# Patient Record
Sex: Male | Born: 2000 | Race: White | Hispanic: No | Marital: Single | Smoking: Never smoker
Health system: Southern US, Community
[De-identification: ages and names within clinical notes are randomized; demographics above are authoritative.]

## PROBLEM LIST (undated history)

## (undated) DIAGNOSIS — F819 Developmental disorder of scholastic skills, unspecified: Secondary | ICD-10-CM

## (undated) DIAGNOSIS — F909 Attention-deficit hyperactivity disorder, unspecified type: Secondary | ICD-10-CM

## (undated) DIAGNOSIS — R251 Tremor, unspecified: Secondary | ICD-10-CM

## (undated) DIAGNOSIS — D229 Melanocytic nevi, unspecified: Secondary | ICD-10-CM

## (undated) DIAGNOSIS — J45909 Unspecified asthma, uncomplicated: Secondary | ICD-10-CM

## (undated) HISTORY — DX: Developmental disorder of scholastic skills, unspecified: F81.9

## (undated) HISTORY — DX: Tremor, unspecified: R25.1

## (undated) HISTORY — DX: Melanocytic nevi, unspecified: D22.9

## (undated) HISTORY — DX: Unspecified asthma, uncomplicated: J45.909

## (undated) HISTORY — PX: OTHER SURGICAL HISTORY: SHX169

## (undated) HISTORY — PX: IRRIGATION AND DEBRIDEMENT SEBACEOUS CYST: SHX5255

## (undated) HISTORY — PX: CIRCUMCISION: SUR203

---
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
## 2019-02-23 ENCOUNTER — Encounter: Payer: BLUE CROSS/BLUE SHIELD | Admitting: Family Medicine

## 2019-04-01 ENCOUNTER — Other Ambulatory Visit: Payer: Self-pay

## 2019-04-01 ENCOUNTER — Ambulatory Visit (INDEPENDENT_AMBULATORY_CARE_PROVIDER_SITE_OTHER): Payer: BLUE CROSS/BLUE SHIELD | Admitting: Family Medicine

## 2019-04-01 DIAGNOSIS — F909 Attention-deficit hyperactivity disorder, unspecified type: Secondary | ICD-10-CM | POA: Diagnosis not present

## 2019-04-01 MED ORDER — AMPHETAMINE-DEXTROAMPHET ER 20 MG PO CP24: 20.0000 mg | ORAL_CAPSULE | ORAL | 0 refills | Status: DC

## 2019-04-01 NOTE — Progress Notes (Signed)
   Subjective:    Patient ID: James Carson, male    DOB: [birth year], 18 y.o.   MRN: [id number]  HPI  Patient calls for an ADHD check up. Patient just restarted medication 3 days ago and is having no problems. This patient is taking 2 online classes plans on graduating toward the end of May although the graduation may be delayed until they can have gatherings He is doing well with his classes English and history and studying those online he feels he is adjusting well He denies any setbacks Denies any other particular health issues or problems  Virtual Visit via Video Note  I connected with James Carson on 04/01/19 at  1:10 PM EDT by a video enabled telemedicine application and verified that I am speaking with the correct person using two identifiers.  Location: Patient: home Provider: office   I discussed the limitations of evaluation and management by telemedicine and the availability of in person appointments. The patient expressed understanding and agreed to proceed.  History of Present Illness:    Observations/Objective:   Assessment and Plan:   Follow Up Instructions:    I discussed the assessment and treatment plan with the patient. The patient was provided an opportunity to ask questions and all were answered. The patient agreed with the plan and demonstrated an understanding of the instructions.   The patient was advised to call back or seek an in-person evaluation if the symptoms worsen or if the condition fails to improve as anticipated.  I provided 15 minutes of non-face-to-face time during this encounter.      Review of Systems     Objective:   Physical Exam        Assessment & Plan:  ADD Overall doing well on medication Patient will watch closely if doing well otherwise we will follow-up in 3 months otherwise will call us if any problems Young man will graduate he is not sure about college later this year no other particular health  problems follow-up in 3 months

## 2019-07-22 ENCOUNTER — Emergency Department
Admission: EM | Admit: 2019-07-22 | Discharge: 2019-07-22 | Disposition: A | Discharge status: Home / Self Care | Payer: No Typology Code available for payment source | Attending: Emergency Medicine | Admitting: Emergency Medicine

## 2019-07-22 ENCOUNTER — Emergency Department: Payer: No Typology Code available for payment source

## 2019-07-22 DIAGNOSIS — Z79899 Other long term (current) drug therapy: Secondary | ICD-10-CM | POA: Insufficient documentation

## 2019-07-22 DIAGNOSIS — S6991XA Unspecified injury of right wrist, hand and finger(s), initial encounter: Secondary | ICD-10-CM | POA: Diagnosis present

## 2019-07-22 DIAGNOSIS — S60450A Superficial foreign body of right index finger, initial encounter: Secondary | ICD-10-CM | POA: Diagnosis not present

## 2019-07-22 DIAGNOSIS — Y9389 Activity, other specified: Secondary | ICD-10-CM | POA: Insufficient documentation

## 2019-07-22 DIAGNOSIS — J45909 Unspecified asthma, uncomplicated: Secondary | ICD-10-CM | POA: Insufficient documentation

## 2019-07-22 DIAGNOSIS — W458XXA Other foreign body or object entering through skin, initial encounter: Secondary | ICD-10-CM | POA: Insufficient documentation

## 2019-07-22 DIAGNOSIS — Z1810 Retained metal fragments, unspecified: Secondary | ICD-10-CM | POA: Diagnosis not present

## 2019-07-22 DIAGNOSIS — S60459A Superficial foreign body of unspecified finger, initial encounter: Secondary | ICD-10-CM

## 2019-07-22 DIAGNOSIS — Y990 Civilian activity done for income or pay: Secondary | ICD-10-CM | POA: Diagnosis not present

## 2019-07-22 DIAGNOSIS — Y9289 Other specified places as the place of occurrence of the external cause: Secondary | ICD-10-CM | POA: Insufficient documentation

## 2019-07-22 NOTE — ED Notes (Signed)
Per workmans comp profile, UDS and breath analysys is required and  Blood ETOH is acceptable if BAt machine is not available.  Was unable to get machine to transfer over to printer.  Performed blood ETOH level as per profile states

## 2019-07-22 NOTE — ED Triage Notes (Signed)
See paper chart for downtime documentation 

## 2019-07-22 NOTE — ED Provider Notes (Signed)
[facility] Emergency Department Provider Note   First MD Initiated Contact with Patient 07/22/19 [phone]     (approximate)  I have reviewed the triage vital signs and the nursing notes.   HISTORY  Chief Complaint Foreign Body   HPI James Carson is a 18 y.o. male with below list of previous medical conditions presents to the emergency department secondary to weaving needle stuck in his right index finger which occurred while at work tonight.        Past Medical History:  Diagnosis Date  . ADHD (attention deficit hyperactivity disorder)   . Asthma   . Atypical nevi    right shoulder  . Learning disability    dyslexia  . Tremors of nervous system    benign tremors-Dr. Gaynell Face    Patient Active Problem List   Diagnosis Date Noted  . Learning disability 07/21/2014  . ADD (attention deficit disorder) 12/15/2013    Past Surgical History:  Procedure Laterality Date  . CIRCUMCISION    . IRRIGATION AND DEBRIDEMENT SEBACEOUS CYST    . ritlumph    . tubes in the ears      Prior to Admission medications   Medication Sig Start Date End Date Taking? Authorizing Provider  amphetamine-dextroamphetamine (ADDERALL XR) 20 MG 24 hr capsule Take 1 capsule (20 mg total) by mouth every morning. 04/01/19   Kathyrn Drown, MD  amphetamine-dextroamphetamine (ADDERALL XR) 20 MG 24 hr capsule Take 1 capsule (20 mg total) by mouth every morning. 04/01/19   Kathyrn Drown, MD  amphetamine-dextroamphetamine (ADDERALL XR) 20 MG 24 hr capsule Take 1 capsule (20 mg total) by mouth every morning. 04/01/19   Kathyrn Drown, MD  ondansetron (ZOFRAN) 8 MG tablet Take 1 tablet (8 mg total) by mouth every 8 (eight) hours as needed for nausea. Patient not taking: Reported on 11/17/2018 05/16/18   Kathyrn Drown, MD    Allergies Patient has no known allergies.  No family history on file.  Social History Social History   Tobacco Use  . Smoking status: Never Smoker   . Smokeless tobacco: Never Used  Substance Use Topics  . Alcohol use: No    Alcohol/week: 0.0 standard drinks  . Drug use: No    Review of Systems Constitutional: No fever/chills Eyes: No visual changes. ENT: No sore throat. Cardiovascular: Denies chest pain. Respiratory: Denies shortness of breath. Gastrointestinal: No abdominal pain.  No nausea, no vomiting.  No diarrhea.  No constipation. Genitourinary: Negative for dysuria. Musculoskeletal: Negative for neck pain.  Negative for back pain.  Needle stuck in the right index finger Integumentary: Negative for rash. Neurological: Negative for headaches, focal weakness or numbness.  ____________________________________________   PHYSICAL EXAM:  VITAL SIGNS: ED Triage Vitals  Enc Vitals Group     BP 07/22/19 0631 128/62     Pulse Rate 07/22/19 0632 72     Resp --      Temp --      Temp src --      SpO2 07/22/19 0632 98 %     Weight --      Height --      Head Circumference --      Peak Flow --      Pain Score 07/22/19 0633 0     Pain Loc --      Pain Edu? --      Excl. in [place]? --     Constitutional: Alert and oriented.  Eyes: Conjunctivae  are normal.  Mouth/Throat: Mucous membranes are moist. Neck: No stridor.  No meningeal signs.   Cardiovascular: Normal rate, regular rhythm. Good peripheral circulation. Grossly normal heart sounds. Respiratory: Normal respiratory effort.  No retractions. Gastrointestinal: Soft and nontender. No distention.  Musculoskeletal: No lower extremity tenderness nor edema. No gross deformities of extremities. Neurologic:  Normal speech and language. No gross focal neurologic deficits are appreciated.  Skin:  Skin is warm, dry and intact. Psychiatric: Mood and affect are normal. Speech and behavior are normal.   ____________________________________________  RADIOLOGY I, Lawtey N Carrieanne Kleen, personally viewed and evaluated these images (plain radiographs) as part of my medical decision  making, as well as reviewing the written report by the radiologist.  ED MD interpretation: Metallic foreign body in the distal soft tissue of the base of the right index finger.  Official radiology report(s): Dg Finger Index Right  Result Date: 07/22/2019 CLINICAL DATA:  Needle stuck at distal RIGHT index finger, injured at work EXAM: RIGHT INDEX FINGER 2+V needle stuck at distal RIGHT index finger, injured at work COMPARISON:  None FINDINGS: Osseous mineralization normal. Joint spaces preserved. No fracture, dislocation, or bone destruction. Lung linear metallic foreign body consistent with a needle is identified at the soft tissues at the radial aspect of the base of the distal phalanx. The wire/needle has a hook at the tip. IMPRESSION: Metallic foreign body at radial soft tissues at base of distal phalanx RIGHT index finger, containing a hook at the tip. No acute osseous abnormalities. Electronically Signed   By: Lavonia Dana M.D.   On: 07/22/2019 11:47     Procedures   ____________________________________________   INITIAL IMPRESSION / MDM / [place] / ED COURSE  As part of my medical decision making, I reviewed the following data within the electronic MEDICAL RECORD NUMBER   18 year old male with right index finger foreign body.  Finger was cleaned with Betadine 3 mL's of lidocaine 1% without epinephrine was introduced subcutaneously.  Gentle traction was applied after a 2 mm incision was made and the needle was removed without difficulty.  Patient tolerated procedure well.       ____________________________________________  FINAL CLINICAL IMPRESSION(S) / ED DIAGNOSES  Final diagnoses:  Foreign body of right index finger     MEDICATIONS GIVEN DURING THIS VISIT:  Medications - No data to display   ED Discharge Orders    None      *Please note:  James Carson was evaluated in Emergency Department on 07/22/2019 for the symptoms described in the history of  present illness. He was evaluated in the context of the global COVID-19 pandemic, which necessitated consideration that the patient might be at risk for infection with the SARS-CoV-2 virus that causes COVID-19. Institutional protocols and algorithms that pertain to the evaluation of patients at risk for COVID-19 are in a state of rapid change based on information released by regulatory bodies including the CDC and federal and state organizations. These policies and algorithms were followed during the patient's care in the ED.  Some ED evaluations and interventions may be delayed as a result of limited staffing during the pandemic.*  Note:  This document was prepared using Dragon voice recognition software and may include unintentional dictation errors.   Gregor Hams, MD 07/22/19 [phone]

## 2019-08-16 ENCOUNTER — Other Ambulatory Visit: Payer: Self-pay | Admitting: Family Medicine

## 2019-08-16 ENCOUNTER — Telehealth: Payer: Self-pay | Admitting: Family Medicine

## 2019-08-16 MED ORDER — AMPHETAMINE-DEXTROAMPHET ER 20 MG PO CP24: 20.0000 mg | ORAL_CAPSULE | ORAL | 0 refills | Status: DC

## 2019-08-16 NOTE — Telephone Encounter (Signed)
Patient has an appt on 08-30-19 but is out of amphetamine-dextroamphetamine (ADDERALL XR) 20 MG 24 hr capsule  [facility] [address]

## 2019-08-16 NOTE — Telephone Encounter (Signed)
[facility]-[address] follow-up appointment

## 2019-08-27 ENCOUNTER — Ambulatory Visit: Payer: Self-pay | Admitting: Family Medicine

## 2019-08-30 ENCOUNTER — Other Ambulatory Visit: Payer: Self-pay

## 2019-08-30 ENCOUNTER — Ambulatory Visit (INDEPENDENT_AMBULATORY_CARE_PROVIDER_SITE_OTHER): Payer: BC Managed Care – PPO | Admitting: Family Medicine

## 2019-08-30 DIAGNOSIS — F909 Attention-deficit hyperactivity disorder, unspecified type: Secondary | ICD-10-CM | POA: Diagnosis not present

## 2019-08-30 MED ORDER — AMPHETAMINE-DEXTROAMPHET ER 20 MG PO CP24: 20.0000 mg | ORAL_CAPSULE | ORAL | 0 refills | Status: DC

## 2019-08-30 NOTE — Progress Notes (Signed)
   Subjective:    Patient ID: James Carson, male    DOB: 13-Nov-2001, 18 y.o.   MRN: [id number]  HPI Patient was seen today for ADD checkup.  This patient does have ADD.  Patient takes medications for this.  If this does help control overall symptoms.  Please see below. -weight, vital signs reviewed.  The following items were covered. -Compliance with medication : Adderall 20 mg each morning  -Problems with completing homework, paying attention/taking good notes in school: none; pt graduated in the spring  -grades: n/a  - Eating patterns : mom states that med changes pt appetite  -sleeping: ok  -Additional issues or questions: none  Virtual Visit via Video Note  I connected with James Carson on 08/30/19 at  8:30 AM EDT by a video enabled telemedicine application and verified that I am speaking with the correct person using two identifiers.  Location: Patient: Home Provider: Office   I discussed the limitations of evaluation and management by telemedicine and the availability of in person appointments. The patient expressed understanding and agreed to proceed.  History of Present Illness:    Observations/Objective:   Assessment and Plan:   Follow Up Instructions:    I discussed the assessment and treatment plan with the patient. The patient was provided an opportunity to ask questions and all were answered. The patient agreed with the plan and demonstrated an understanding of the instructions.   The patient was advised to call back or seek an in-person evaluation if the symptoms worsen or if the condition fails to improve as anticipated.  I provided 15 minutes of non-face-to-face time during this encounter.   Vicente Males, LPN    Review of Systems  Constitutional: Negative for activity change, appetite change and fatigue.  HENT: Negative for congestion and rhinorrhea.   Respiratory: Negative for cough and shortness of breath.   Cardiovascular:  Negative for chest pain and leg swelling.  Gastrointestinal: Negative for abdominal pain, nausea and vomiting.  Neurological: Negative for dizziness and headaches.  Psychiatric/Behavioral: Negative for agitation and behavioral problems.       Objective:   Physical Exam  Patient had virtual visit Appears to be in no distress Atraumatic Neuro able to relate and oriented No apparent resp distress Color normal       Assessment & Plan:  The patient was seen today as part of the visit regarding ADD. Medications were reviewed with the patient as well as compliance. Side effects were checked for. Discussion regarding effectiveness was held. Prescriptions were written. Patient reminded to follow-up in approximately 3 months. Behavioral and study issues were addressed.  Plans to [facility] law with drug registry was checked and verified while present with the patient. He is now working at [employer] during the day Work is going well for him initially he was on the third shift job and that was not a good fit so he is switched over to [employer] and working there  He states the medicine is doing well for him we will send in 3 additional scripts and we will encourage patient to do a follow-up in early January and a wellness check in the spring  Also patient encouraged to do flu shot

## 2020-01-19 ENCOUNTER — Other Ambulatory Visit: Payer: Self-pay | Admitting: Family Medicine

## 2020-01-19 MED ORDER — AMPHETAMINE-DEXTROAMPHET ER 20 MG PO CP24: 20.0000 mg | ORAL_CAPSULE | ORAL | 0 refills | Status: DC

## 2020-01-27 ENCOUNTER — Other Ambulatory Visit: Payer: Self-pay

## 2020-01-27 ENCOUNTER — Ambulatory Visit: Payer: BC Managed Care – PPO

## 2020-01-27 DIAGNOSIS — Z20822 Contact with and (suspected) exposure to covid-19: Secondary | ICD-10-CM

## 2020-01-27 LAB — POC COVID19 BINAXNOW: SARS Coronavirus 2 Ag: NEGATIVE

## 2020-01-27 NOTE — Progress Notes (Signed)
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

## 2020-02-09 ENCOUNTER — Ambulatory Visit
Admission: EM | Admit: 2020-02-09 | Discharge: 2020-02-09 | Disposition: A | Discharge status: Home / Self Care | Payer: BC Managed Care – PPO

## 2020-02-09 ENCOUNTER — Other Ambulatory Visit: Payer: Self-pay

## 2020-02-09 DIAGNOSIS — S6991XA Unspecified injury of right wrist, hand and finger(s), initial encounter: Secondary | ICD-10-CM

## 2020-02-09 NOTE — ED Triage Notes (Signed)
Pt presents to UC w/ c/o fish hook stuck in right thumb which occurred a few minutes ago. Pt denies bleeding.

## 2020-02-09 NOTE — ED Provider Notes (Addendum)
[facility]   [id number] 02/09/20 Arrival Time: 1914   [id number] hook foreign body  SUBJECTIVE:  James Carson is a 19 y.o. male who presents with a fish hook to RT thumb that occurred a few hours ago.  Denies bleeding.  Tried to remove at home without relief.  Reports previous symptoms in the past and had to go to the ED to have it removed.  Denies fever, chills, nausea, vomiting.    ROS: As per HPI.  All other pertinent ROS negative.     Past Medical History:  Diagnosis Date  . ADHD (attention deficit hyperactivity disorder)   . Asthma   . Atypical nevi    right shoulder  . Learning disability    dyslexia  . Tremors of nervous system    benign tremors-Dr. Gaynell Face   Past Surgical History:  Procedure Laterality Date  . CIRCUMCISION    . IRRIGATION AND DEBRIDEMENT SEBACEOUS CYST    . ritlumph    . tubes in the ears     No Known Allergies No current facility-administered medications on file prior to encounter.   Current Outpatient Medications on File Prior to Encounter  Medication Sig Dispense Refill  . amphetamine-dextroamphetamine (ADDERALL XR) 20 MG 24 hr capsule Take 1 capsule (20 mg total) by mouth every morning. 30 capsule 0  . amphetamine-dextroamphetamine (ADDERALL XR) 20 MG 24 hr capsule Take 1 capsule (20 mg total) by mouth every morning. 30 capsule 0  . amphetamine-dextroamphetamine (ADDERALL XR) 20 MG 24 hr capsule Take 1 capsule (20 mg total) by mouth every morning. 30 capsule 0  . ondansetron (ZOFRAN) 8 MG tablet Take 1 tablet (8 mg total) by mouth every 8 (eight) hours as needed for nausea. (Patient not taking: Reported on 11/17/2018) 14 tablet 2   Social History   Socioeconomic History  . Marital status: Single    Spouse name: Not on file  . Number of children: Not on file  . Years of education: Not on file  . Highest education level: Not on file  Occupational History  . Not on file  Tobacco Use  . Smoking status: Never Smoker  .  Smokeless tobacco: Never Used  Substance and Sexual Activity  . Alcohol use: No    Alcohol/week: 0.0 standard drinks  . Drug use: No  . Sexual activity: Not on file  Other Topics Concern  . Not on file  Social History Narrative  . Not on file   Social Determinants of Health   Financial Resource Strain:   . Difficulty of Paying Living Expenses: Not on file  Food Insecurity:   . Worried About Charity fundraiser in the Last Year: Not on file  . Ran Out of Food in the Last Year: Not on file  Transportation Needs:   . Lack of Transportation (Medical): Not on file  . Lack of Transportation (Non-Medical): Not on file  Physical Activity:   . Days of Exercise per Week: Not on file  . Minutes of Exercise per Session: Not on file  Stress:   . Feeling of Stress : Not on file  Social Connections:   . Frequency of Communication with Friends and Family: Not on file  . Frequency of Social Gatherings with Friends and Family: Not on file  . Attends Religious Services: Not on file  . Active Member of Clubs or Organizations: Not on file  . Attends Archivist Meetings: Not on file  . Marital Status: Not on file  Intimate Partner Violence:   . Fear of Current or Ex-Partner: Not on file  . Emotionally Abused: Not on file  . Physically Abused: Not on file  . Sexually Abused: Not on file   Family History  Problem Relation Age of Onset  . Healthy Mother   . Healthy Father     OBJECTIVE:  Vitals:   02/09/20 1920  BP: 122/72  Pulse: 79  Resp: 17  Temp: 98.8 F (37.1 C)  TempSrc: Tympanic  SpO2: 98%     General appearance: alert; no distress Skin: Small fish hook with barb present to ventral aspect of distal RT thumb Psychological: alert and cooperative; normal mood and affect  Procedure: Verbal consent obtained. Puncture wound with FB cleaned with betadine. Lidocaine 2% without epinephrine used to obtain local anesthesia. Fish hook removed using kelly hemostat.   Tolerated procedure well.  No complications  ASSESSMENT & PLAN:  1. Fish hook injury of right thumb, initial encounter    Fish hook removed Dressing applied Keep covered to avoid friction Return or go to the ED if you have any new or worsening symptoms such as increased redness, swelling, pain, nausea, vomiting, fever, chills, etc...   Reviewed expectations re: course of current medical issues. Questions answered. Outlined signs and symptoms indicating need for more acute intervention. Patient verbalized understanding. After Visit Summary given.          Lestine Box, PA-C 02/09/20 2007    Stacey Drain Mason, [place] 02/10/20 [phone]

## 2020-02-09 NOTE — Discharge Instructions (Addendum)
Fish hook removed Dressing applied Keep covered to avoid friction Return or go to the ED if you have any new or worsening symptoms such as increased redness, swelling, pain, nausea, vomiting, fever, chills, etc..Marland Kitchen

## 2020-02-15 ENCOUNTER — Other Ambulatory Visit: Payer: Self-pay | Admitting: Family Medicine

## 2020-02-15 MED ORDER — AMPHETAMINE-DEXTROAMPHET ER 20 MG PO CP24: 20.0000 mg | ORAL_CAPSULE | ORAL | 0 refills | Status: DC

## 2020-02-17 ENCOUNTER — Ambulatory Visit (INDEPENDENT_AMBULATORY_CARE_PROVIDER_SITE_OTHER): Payer: BC Managed Care – PPO | Admitting: Family Medicine

## 2020-02-17 DIAGNOSIS — F909 Attention-deficit hyperactivity disorder, unspecified type: Secondary | ICD-10-CM

## 2020-02-17 NOTE — Progress Notes (Signed)
   Subjective:    Patient ID: James Carson, male    DOB: 2001/09/04, 19 y.o.   MRN: [id number]  HPI Patient calls today for refill on his ADHD medications. Patient states everything seems to be going well and does not have any other concerns. Patient working at [employer] his medication on a regular basis States overall things are going well for him Recently he lost his grandfather and is been difficult for him to work through that he feels he is working through his emotions.  I asked him if he is feeling stressed or anxious for having difficulty sleeping and he states he is not having any trouble. Patient does golfing and fishing for hobbies Patient denies any setbacks or problems His ADD medicine does allow him to help cope with function Virtual Visit via Video Note  I connected with James Carson on 02/17/20 at  3:30 PM EDT by a video enabled telemedicine application and verified that I am speaking with the correct person using two identifiers.  Location: Patient: home Provider: office   I discussed the limitations of evaluation and management by telemedicine and the availability of in person appointments. The patient expressed understanding and agreed to proceed.  History of Present Illness:    Observations/Objective:   Assessment and Plan:   Follow Up Instructions:    I discussed the assessment and treatment plan with the patient. The patient was provided an opportunity to ask questions and all were answered. The patient agreed with the plan and demonstrated an understanding of the instructions.   The patient was advised to call back or seek an in-person evaluation if the symptoms worsen or if the condition fails to improve as anticipated.  I provided 16 minutes of non-face-to-face time during this encounter.      Review of Systems  Constitutional: Negative for activity change, appetite change and fatigue.  HENT: Negative for congestion and  rhinorrhea.   Respiratory: Negative for cough and shortness of breath.   Cardiovascular: Negative for chest pain and leg swelling.  Gastrointestinal: Negative for abdominal pain, nausea and vomiting.  Neurological: Negative for dizziness and headaches.  Psychiatric/Behavioral: Negative for agitation and behavioral problems.       Objective:   Physical Exam   Patient had virtual visit Appears to be in no distress Atraumatic Neuro able to relate and oriented No apparent resp distress Color normal      Assessment & Plan:  Adult ADD Does well with medicine 3 scripts will be sent in Drug registry was checked Follow-up if progressive troubles or if worse Patient did have a loss of her grandfather but he states he is working through his motions and does not feel he needs any type of additional care for this Denies sleeping issues or stress issues I did indicate to the patient should he start feeling like he is feeling down sad or depressed to follow-up or call us we will be happy to help

## 2020-02-18 MED ORDER — AMPHETAMINE-DEXTROAMPHET ER 20 MG PO CP24: 20.0000 mg | ORAL_CAPSULE | ORAL | 0 refills | Status: DC

## 2020-03-29 ENCOUNTER — Emergency Department (HOSPITAL_COMMUNITY): Payer: BC Managed Care – PPO

## 2020-03-29 ENCOUNTER — Encounter: Payer: Self-pay | Admitting: Family Medicine

## 2020-03-29 ENCOUNTER — Encounter (HOSPITAL_COMMUNITY): Payer: Self-pay | Admitting: Emergency Medicine

## 2020-03-29 ENCOUNTER — Emergency Department (HOSPITAL_COMMUNITY)
Admission: EM | Admit: 2020-03-29 | Discharge: 2020-03-29 | Disposition: A | Discharge status: Home / Self Care | Payer: BC Managed Care – PPO | Attending: Emergency Medicine | Admitting: Emergency Medicine

## 2020-03-29 ENCOUNTER — Other Ambulatory Visit: Payer: Self-pay

## 2020-03-29 DIAGNOSIS — F909 Attention-deficit hyperactivity disorder, unspecified type: Secondary | ICD-10-CM | POA: Diagnosis not present

## 2020-03-29 DIAGNOSIS — W010XXA Fall on same level from slipping, tripping and stumbling without subsequent striking against object, initial encounter: Secondary | ICD-10-CM | POA: Insufficient documentation

## 2020-03-29 DIAGNOSIS — Z79899 Other long term (current) drug therapy: Secondary | ICD-10-CM | POA: Insufficient documentation

## 2020-03-29 DIAGNOSIS — M545 Low back pain: Secondary | ICD-10-CM | POA: Diagnosis not present

## 2020-03-29 DIAGNOSIS — M791 Myalgia, unspecified site: Secondary | ICD-10-CM | POA: Diagnosis not present

## 2020-03-29 DIAGNOSIS — Y999 Unspecified external cause status: Secondary | ICD-10-CM | POA: Diagnosis not present

## 2020-03-29 DIAGNOSIS — Y93I9 Activity, other involving external motion: Secondary | ICD-10-CM | POA: Diagnosis not present

## 2020-03-29 DIAGNOSIS — M7918 Myalgia, other site: Secondary | ICD-10-CM

## 2020-03-29 DIAGNOSIS — Y92410 Unspecified street and highway as the place of occurrence of the external cause: Secondary | ICD-10-CM | POA: Insufficient documentation

## 2020-03-29 DIAGNOSIS — J45909 Unspecified asthma, uncomplicated: Secondary | ICD-10-CM | POA: Insufficient documentation

## 2020-03-29 DIAGNOSIS — M542 Cervicalgia: Secondary | ICD-10-CM | POA: Diagnosis not present

## 2020-03-29 DIAGNOSIS — S59912A Unspecified injury of left forearm, initial encounter: Secondary | ICD-10-CM | POA: Diagnosis not present

## 2020-03-29 DIAGNOSIS — S4992XA Unspecified injury of left shoulder and upper arm, initial encounter: Secondary | ICD-10-CM | POA: Diagnosis not present

## 2020-03-29 DIAGNOSIS — M25512 Pain in left shoulder: Secondary | ICD-10-CM | POA: Diagnosis not present

## 2020-03-29 MED ORDER — ACETAMINOPHEN 500 MG PO TABS
1000.0000 mg | ORAL_TABLET | Freq: Once | ORAL | Status: DC
  Filled 2020-03-29: qty 2

## 2020-03-29 NOTE — Discharge Instructions (Signed)
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
## 2020-03-30 ENCOUNTER — Encounter: Payer: Self-pay | Admitting: Family Medicine

## 2020-03-30 ENCOUNTER — Ambulatory Visit (INDEPENDENT_AMBULATORY_CARE_PROVIDER_SITE_OTHER): Payer: BC Managed Care – PPO | Admitting: Family Medicine

## 2020-03-30 VITALS — BP 126/72 | Temp 97.9°F | Wt 154.6 lb

## 2020-03-30 DIAGNOSIS — F909 Attention-deficit hyperactivity disorder, unspecified type: Secondary | ICD-10-CM | POA: Diagnosis not present

## 2020-03-30 DIAGNOSIS — M25512 Pain in left shoulder: Secondary | ICD-10-CM | POA: Diagnosis not present

## 2020-03-30 MED ORDER — AMPHETAMINE-DEXTROAMPHET ER 30 MG PO CP24: 30.0000 mg | ORAL_CAPSULE | ORAL | 0 refills | Status: DC

## 2020-03-30 NOTE — Patient Instructions (Signed)
Give [place] update in 30 days how new dose is doing
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
## 2020-04-24 ENCOUNTER — Encounter: Payer: Self-pay | Admitting: Family Medicine

## 2020-04-24 ENCOUNTER — Other Ambulatory Visit: Payer: Self-pay | Admitting: Family Medicine

## 2020-04-24 MED ORDER — AMPHETAMINE-DEXTROAMPHET ER 30 MG PO CP24: ORAL_CAPSULE | ORAL | 0 refills | Status: DC

## 2020-04-24 MED ORDER — AMPHETAMINE-DEXTROAMPHET ER 30 MG PO CP24
ORAL_CAPSULE | ORAL | 0 refills | Status: DC
Start: 2020-04-24 — End: 2020-11-29

## 2020-04-24 NOTE — Telephone Encounter (Signed)
Nurses Certainly glad the new dose is helping him Please go ahead with 30 mg Adderall XR 1 every morning #30, may have 3 prescriptions 04/29/2020, 05/29/2020, 06/28/2020  Please pend the prescriptions I will sign off on them later today they should be ready by tomorrow Please notify family via [place] thank you-Dr. Nicki Reaper

## 2020-04-24 NOTE — Addendum Note (Signed)
Addended by: Vicente Males on: 04/24/2020 03:47 PM   Modules accepted: Orders

## 2020-04-24 NOTE — Addendum Note (Signed)
Addended by: Sallee Lange A on: 04/24/2020 10:29 PM   Modules accepted: Orders

## 2020-04-25 NOTE — Telephone Encounter (Signed)
Nurses Please change the date on the prescriptions to 04/28/2020, 05/28/2020, 06/27/2020 Pend these for my signing Then send notification of family that changes were made   Hopefully pharmacy will allow for 1 day early-may have to follow federal regulations as well as we do Thanks-Dr. Nicki Reaper

## 2020-04-26 ENCOUNTER — Other Ambulatory Visit: Payer: Self-pay | Admitting: *Deleted

## 2020-04-26 ENCOUNTER — Telehealth: Payer: Self-pay | Admitting: *Deleted

## 2020-04-26 NOTE — Telephone Encounter (Signed)
Called James Carson and she states James Carson cant drive without his med. Too soon to fill and going out of town tomorrow. Would like to see if James Carson would send to pharm she listed. She states she called them and as of right now they could fill it on the 28th assuming they still have enough supply. She states she was told to have James send in rx and they could tell her tomorrow if they would be able to fill on the 28th. James Carson states she will call tomorrow after James Carson sends it in and pick up on the 28th. If you agree I pended med in patient call.

## 2020-04-27 ENCOUNTER — Other Ambulatory Visit: Payer: Self-pay | Admitting: *Deleted

## 2020-04-27 MED ORDER — AMPHETAMINE-DEXTROAMPHET ER 30 MG PO CP24: ORAL_CAPSULE | ORAL | 0 refills | Status: DC

## 2020-04-27 NOTE — Telephone Encounter (Signed)
Please go ahead and pend the prescription and make sure that it is going to the pharmacy that they are listing and we will sign off on it thank you

## 2020-04-27 NOTE — Telephone Encounter (Signed)
I pended rx in and sent to you through a patient call.

## 2020-04-28 ENCOUNTER — Encounter: Payer: Self-pay | Admitting: *Deleted

## 2020-05-02 NOTE — Telephone Encounter (Signed)
error 

## 2020-06-08 ENCOUNTER — Other Ambulatory Visit: Payer: Self-pay | Admitting: Family Medicine

## 2020-06-08 ENCOUNTER — Encounter: Payer: Self-pay | Admitting: Family Medicine

## 2020-06-08 MED ORDER — DOXYCYCLINE HYCLATE 100 MG PO TABS: 100.0000 mg | ORAL_TABLET | Freq: Two times a day (BID) | ORAL | 0 refills | Status: DC

## 2020-06-27 ENCOUNTER — Telehealth: Payer: Self-pay | Admitting: Family Medicine

## 2020-06-27 NOTE — Telephone Encounter (Signed)
May have virtual appointment for ADD Anywhere over the next couple weeks-by August 20 Late afternoon is fine or 11 AM range

## 2020-06-27 NOTE — Telephone Encounter (Signed)
Pt is going to need a med check after picking up script tomorrow. Pt is requesting it be virtual due to starting a new job that is Monday through Friday 7-5.

## 2020-06-28 NOTE — Telephone Encounter (Signed)
Pt scheduled for 8/18

## 2020-07-13 ENCOUNTER — Encounter: Payer: Self-pay | Admitting: Family Medicine

## 2020-07-13 ENCOUNTER — Other Ambulatory Visit: Payer: Self-pay

## 2020-07-13 ENCOUNTER — Ambulatory Visit (INDEPENDENT_AMBULATORY_CARE_PROVIDER_SITE_OTHER): Payer: BC Managed Care – PPO | Admitting: Family Medicine

## 2020-07-13 ENCOUNTER — Telehealth: Payer: Self-pay | Admitting: Family Medicine

## 2020-07-13 VITALS — BP 112/72 | Temp 97.7°F | Wt 150.8 lb

## 2020-07-13 DIAGNOSIS — F909 Attention-deficit hyperactivity disorder, unspecified type: Secondary | ICD-10-CM

## 2020-07-13 DIAGNOSIS — L03116 Cellulitis of left lower limb: Secondary | ICD-10-CM | POA: Diagnosis not present

## 2020-07-13 MED ORDER — CIPROFLOXACIN HCL 500 MG PO TABS
500.0000 mg | ORAL_TABLET | Freq: Two times a day (BID) | ORAL | 0 refills | Status: DC
Start: 2020-07-13 — End: 2020-12-15

## 2020-07-13 MED ORDER — DOXYCYCLINE HYCLATE 100 MG PO TABS: 100.0000 mg | ORAL_TABLET | Freq: Two times a day (BID) | ORAL | 0 refills | Status: DC

## 2020-07-13 MED ORDER — AMPHETAMINE-DEXTROAMPHET ER 30 MG PO CP24: ORAL_CAPSULE | ORAL | 0 refills | Status: DC

## 2020-07-13 MED ORDER — AMPHETAMINE-DEXTROAMPHET ER 30 MG PO CP24: 30.0000 mg | ORAL_CAPSULE | ORAL | 0 refills | Status: DC

## 2020-07-13 MED ORDER — AMPHETAMINE-DEXTROAMPHET ER 20 MG PO CP24: 20.0000 mg | ORAL_CAPSULE | ORAL | 0 refills | Status: DC

## 2020-07-13 NOTE — Telephone Encounter (Signed)
Patient was seen this morning and now states he has a headache and hot just woke up . Please advise

## 2020-07-13 NOTE — Telephone Encounter (Signed)
Please advise. Thank you

## 2020-07-13 NOTE — Progress Notes (Signed)
   Subjective:    Patient ID: James Carson, male    DOB: 2001/07/20, 19 y.o.   MRN: [id number]  HPI  Patient with red area on left leg. Patient states that it started as a bug bite that seems to have gotten infected. Started off as a bug bite then he scratched it broke the skin then it started getting painful red and swollen then yesterday evening he went fishing and did get the area wet he thinks it was scabbed over before he got wet today and has some pustules but not draining Review of Systems No fever chills sweats wheezing difficulty breathing    Objective:   Physical Exam Lungs clear heart regular pulse normal weight noted cellulitis noted on the lower leg with some edema scab noted       Assessment & Plan:  Cellulitis Wound culture taken Doxycycline twice daily for 10 days avoid excessive sun proper way to take it discussed Cipro twice daily for 7 days Warm compresses frequently Follow-up if progressive troubles  The patient was seen today as part of the visit regarding ADD.  Patient is stable on current regimen.  Appropriate prescriptions prescribed.  Medications were reviewed with the patient as well as compliance. Side effects were checked for. Discussion regarding effectiveness was held. Prescriptions were electronically sent in.  Patient reminded to follow-up in approximately 3 months.   Plans to [facility] law with drug registry was checked and verified while present with the patient. Follow-up 3 to 4 months

## 2020-07-13 NOTE — Patient Instructions (Signed)

## 2020-07-13 NOTE — Telephone Encounter (Signed)
I did discuss this with the mother.  The patient will be coming in to be seen Friday morning 9 AM with me-patient aware

## 2020-07-14 ENCOUNTER — Encounter: Payer: Self-pay | Admitting: Family Medicine

## 2020-07-14 ENCOUNTER — Ambulatory Visit (INDEPENDENT_AMBULATORY_CARE_PROVIDER_SITE_OTHER): Payer: BC Managed Care – PPO | Admitting: Family Medicine

## 2020-07-14 VITALS — BP 118/70 | Temp 98.2°F | Wt 151.6 lb

## 2020-07-14 DIAGNOSIS — L03116 Cellulitis of left lower limb: Secondary | ICD-10-CM | POA: Diagnosis not present

## 2020-07-14 NOTE — Progress Notes (Signed)
   Subjective:    Patient ID: James Carson, male    DOB: Apr 09, 2001, 19 y.o.   MRN: [id number]  HPI Pt here for recheck on cellulitis. Pt states the cellulites did get a little worse last night but is ok this morning. Is taking Cipro and Doxy. Pt did have a headache yesterday evening.  Lower leg cellulitis No severe redness Was swollen last night Felt hot last night but no fevers through the night no vomiting tolerating medicine  Review of Systems Please see above    Objective:   Physical Exam  Knee is normal calf is normal Lower leg has cellulitis with a couple pustules no fluctuance no drainage      Assessment & Plan:  Cellulitis warm compresses finish out the antibiotics is improving some no additional intervention necessary currently warning signs discussed in detail follow-up if problems

## 2020-07-16 LAB — WOUND CULTURE

## 2020-07-17 ENCOUNTER — Telehealth: Payer: Self-pay | Admitting: Family Medicine

## 2020-07-17 NOTE — Telephone Encounter (Signed)
Front When this patient was in the other day for cellulitis I also took care of his ADD  I do not feel he needs this appointment unless he is having an issue for which he feels he needs to be seen  Please connect with the patient if he states he does not need to be seen then canceled this appointment for Wednesday at 4:10 PM

## 2020-07-19 ENCOUNTER — Telehealth: Payer: BC Managed Care – PPO | Admitting: Family Medicine

## 2020-07-30 ENCOUNTER — Encounter: Payer: Self-pay | Admitting: Emergency Medicine

## 2020-07-30 ENCOUNTER — Ambulatory Visit
Admission: EM | Admit: 2020-07-30 | Discharge: 2020-07-30 | Disposition: A | Discharge status: Home / Self Care | Payer: BC Managed Care – PPO

## 2020-07-30 ENCOUNTER — Other Ambulatory Visit: Payer: Self-pay

## 2020-07-30 DIAGNOSIS — S61412A Laceration without foreign body of left hand, initial encounter: Secondary | ICD-10-CM

## 2020-07-30 NOTE — Discharge Instructions (Addendum)
Bandage applied Keep covered for next and dry for next 24-48 hours.  After then you may gently clean with warm water and mild soap.  Avoid submerging wound in water. Change dressing daily and apply a thin layer of neosporin.  Return in 10 days to have sutures removed.   Take OTC ibuprofen or tylenol as needed for pain releif Return sooner or go to the ED if you have any new or worsening symptoms such as increased pain, redness, swelling, drainage, discharge, decreased range of motion of extremity, etc..

## 2020-07-30 NOTE — ED Provider Notes (Signed)
[facility]   [id number] 07/30/20 Arrival Time: 3382  CC: LACERATION  SUBJECTIVE:  James Carson is a 19 y.o. male who presents with a laceration that occurred 1 to 2 hours ago.  Symptoms began after cutting his hand.  Bleeding controlled.  Currently not on blood thinners.  Denies similar symptoms in the past.  Denies fever, chills, nausea, vomiting, redness, swelling, purulent drainage, decrease strength or sensation.   Td UTD: Yes.  ROS: As per HPI.  All other pertinent ROS negative.     Past Medical History:  Diagnosis Date  . ADHD (attention deficit hyperactivity disorder)   . Asthma   . Atypical nevi    right shoulder  . Learning disability    dyslexia  . Tremors of nervous system    benign tremors-Dr. Gaynell Face   Past Surgical History:  Procedure Laterality Date  . CIRCUMCISION    . IRRIGATION AND DEBRIDEMENT SEBACEOUS CYST    . ritlumph    . tubes in the ears     No Known Allergies No current facility-administered medications on file prior to encounter.   Current Outpatient Medications on File Prior to Encounter  Medication Sig Dispense Refill  . amphetamine-dextroamphetamine (ADDERALL XR) 30 MG 24 hr capsule Take one capsule po every morning. 30 capsule 0  . amphetamine-dextroamphetamine (ADDERALL XR) 30 MG 24 hr capsule Take one capsule po every morning 30 capsule 0  . amphetamine-dextroamphetamine (ADDERALL XR) 30 MG 24 hr capsule Take 1 capsule (30 mg total) by mouth every morning. 30 capsule 0  . amphetamine-dextroamphetamine (ADDERALL XR) 30 MG 24 hr capsule Take one capsule po every morning 30 capsule 0  . ciprofloxacin (CIPRO) 500 MG tablet Take 1 tablet (500 mg total) by mouth 2 (two) times daily. 14 tablet 0  . doxycycline (VIBRA-TABS) 100 MG tablet Take 1 tablet (100 mg total) by mouth 2 (two) times daily. 20 tablet 0   Social History   Socioeconomic History  . Marital status: Single    Spouse name: Not on file  . Number of children:  Not on file  . Years of education: Not on file  . Highest education level: Not on file  Occupational History  . Not on file  Tobacco Use  . Smoking status: Never Smoker  . Smokeless tobacco: Never Used  Substance and Sexual Activity  . Alcohol use: No    Alcohol/week: 0.0 standard drinks  . Drug use: No  . Sexual activity: Not on file  Other Topics Concern  . Not on file  Social History Narrative  . Not on file   Social Determinants of Health   Financial Resource Strain:   . Difficulty of Paying Living Expenses: Not on file  Food Insecurity:   . Worried About Charity fundraiser in the Last Year: Not on file  . Ran Out of Food in the Last Year: Not on file  Transportation Needs:   . Lack of Transportation (Medical): Not on file  . Lack of Transportation (Non-Medical): Not on file  Physical Activity:   . Days of Exercise per Week: Not on file  . Minutes of Exercise per Session: Not on file  Stress:   . Feeling of Stress : Not on file  Social Connections:   . Frequency of Communication with Friends and Family: Not on file  . Frequency of Social Gatherings with Friends and Family: Not on file  . Attends Religious Services: Not on file  . Active Member of Clubs  or Organizations: Not on file  . Attends Archivist Meetings: Not on file  . Marital Status: Not on file  Intimate Partner Violence:   . Fear of Current or Ex-Partner: Not on file  . Emotionally Abused: Not on file  . Physically Abused: Not on file  . Sexually Abused: Not on file   Family History  Problem Relation Age of Onset  . Healthy Mother   . Healthy Father      OBJECTIVE:  Vitals:   07/30/20 1520  BP: 111/63  Pulse: 86  Resp: 16  Temp: 98.2 F (36.8 C)  TempSrc: Oral  SpO2: 98%     General appearance: alert; no distress Chest: CTA, heart sounds normal Heart: RRR, no murmur, rub or gallop Skin: laceration of left palm; size: approx 2.7 cm Psychological: alert and cooperative;  normal mood and affect   Results for orders placed or performed in visit on 07/13/20  Wound culture   Specimen: Wound   Wound  Result Value Ref Range   Gram Stain Result Final report    Organism ID, Bacteria Comment    Organism ID, Bacteria Comment    Aerobic Bacterial Culture Final report (A)    Organism ID, Bacteria Staphylococcus aureus (A)    Antimicrobial Susceptibility Comment     Labs Reviewed - No data to display  No results found.  Procedure: Verbal consent obtained. Patient provided with risks and alternatives to the procedure. Wound copiously irrigated with NS then cleansed with betadine. Anesthetized with 3 mL of lidocaine with epinephrine after LET. Wound carefully explored. No foreign body, tendon injury, or nonviable tissue were noted. Using sterile technique 2 interrupted 5-0 Ethilon Prolene sutures were placed to reapproximate the wound. Patient tolerated procedure well. No complications. Minimal bleeding. Patient advised to look for and return for any signs of infection such as redness, swelling, discharge, or worsening pain. Return for suture removal in 7 days.  ASSESSMENT & PLAN:  No diagnosis found.  No orders of the defined types were placed in this encounter.   Bandage applied Keep covered for next and dry for next 24-48 hours.  After then you may gently clean with warm water and mild soap.  Avoid submerging wound in water. Change dressing daily and apply a thin layer of neosporin.  Return in 10 days to have sutures removed.   Take OTC ibuprofen or tylenol as needed for pain releif Return sooner or go to the ED if you have any new or worsening symptoms such as increased pain, redness, swelling, drainage, discharge, decreased range of motion of extremity, etc..     Reviewed expectations re: course of current medical issues. Questions answered. Outlined signs and symptoms indicating need for more acute intervention. Patient verbalized understanding. After  Visit Summary given.   Note: This document was prepared using Dragon voice recognition software and may include unintentional dictation errors.    Emerson Monte, FNP 07/30/20 [phone]

## 2020-07-30 NOTE — ED Triage Notes (Signed)
Laceration to LT palm from box cutter. No active bleeding

## 2020-08-01 ENCOUNTER — Other Ambulatory Visit: Payer: Self-pay | Admitting: Family Medicine

## 2020-08-01 ENCOUNTER — Telehealth: Payer: Self-pay | Admitting: Family Medicine

## 2020-08-01 ENCOUNTER — Encounter: Payer: Self-pay | Admitting: Family Medicine

## 2020-08-01 MED ORDER — AMPHETAMINE-DEXTROAMPHET ER 30 MG PO CP24: 30.0000 mg | ORAL_CAPSULE | ORAL | 0 refills | Status: DC

## 2020-08-01 NOTE — Telephone Encounter (Signed)
Pt needs refill for 30 mg amphetamine-dextroamphetamine (ADDERALL XR) 30 MG 24 hr capsule [[id number]] [id number] - [place], [place] is where she wants meds sent to   Pt mother call back [phone]

## 2020-08-01 NOTE — Telephone Encounter (Signed)
Last ADD check up 02/17/20. Next appt is 10/13/20

## 2020-08-01 NOTE — Telephone Encounter (Signed)
[facility]

## 2020-10-13 ENCOUNTER — Ambulatory Visit: Payer: BC Managed Care – PPO | Admitting: Family Medicine

## 2020-10-31 ENCOUNTER — Encounter: Payer: Self-pay | Admitting: Family Medicine

## 2020-10-31 ENCOUNTER — Other Ambulatory Visit: Payer: Self-pay | Admitting: Family Medicine

## 2020-10-31 MED ORDER — AMPHETAMINE-DEXTROAMPHET ER 30 MG PO CP24: ORAL_CAPSULE | ORAL | 0 refills | Status: DC

## 2020-10-31 NOTE — Telephone Encounter (Signed)
Nurses  I sent in a 30-day prescription to [facility] [address]  Please let Donalda Ewings know that his medication was sent in and a follow-up in December would be fine.  Thanks-Dr. Nicki Reaper

## 2020-11-03 DIAGNOSIS — H5203 Hypermetropia, bilateral: Secondary | ICD-10-CM | POA: Diagnosis not present

## 2020-11-03 DIAGNOSIS — H52223 Regular astigmatism, bilateral: Secondary | ICD-10-CM | POA: Diagnosis not present

## 2020-11-14 IMAGING — DX DG CLAVICLE*L*
2 series · 2 of 2 positions shown · non-contrast
Comparison: None.

CLINICAL DATA: Clavicle pain

EXAM:
LEFT CLAVICLE - 2+ VIEWS

[clavicle ap]
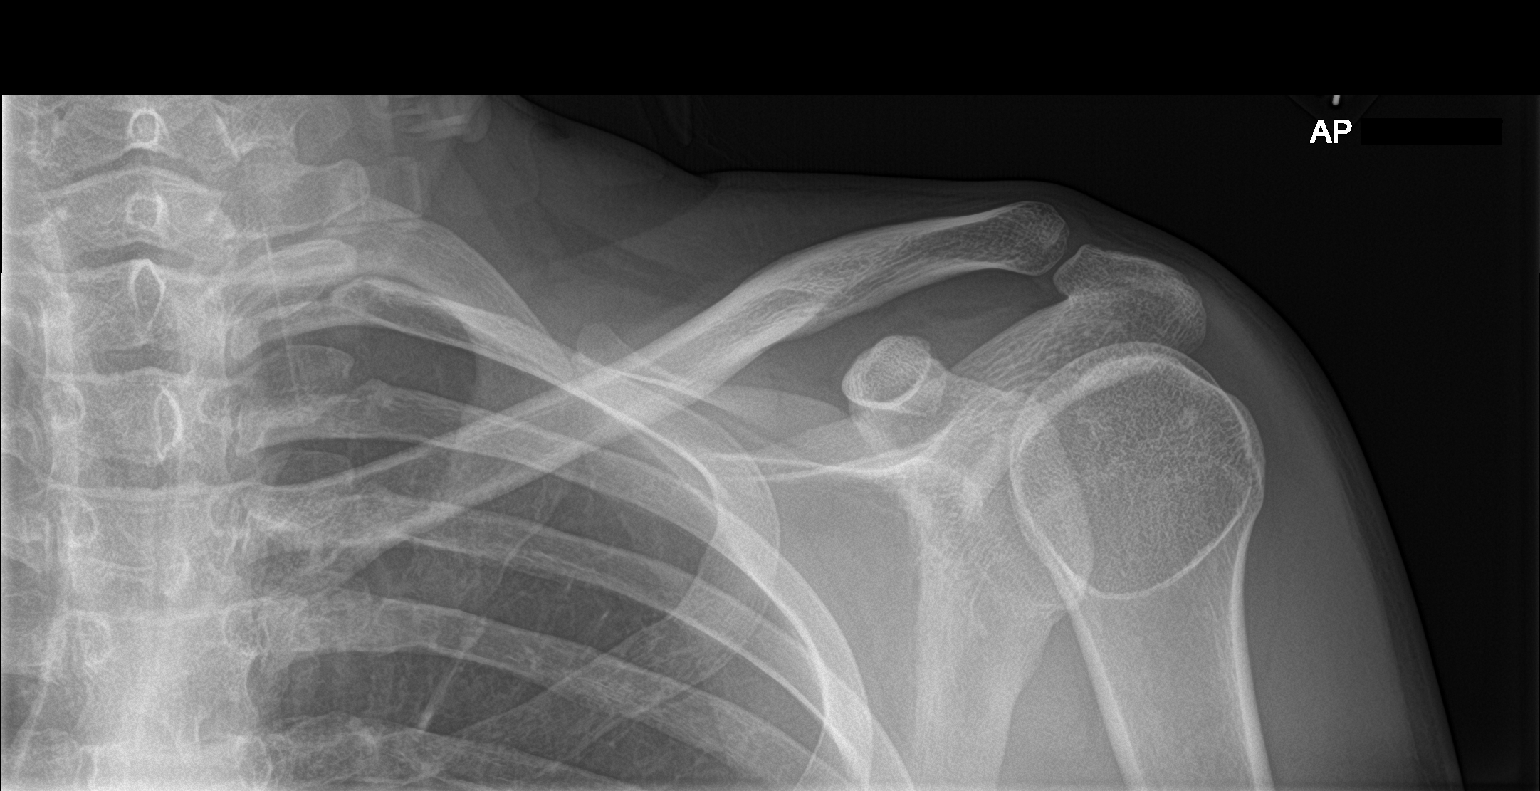

[clavicle axial]
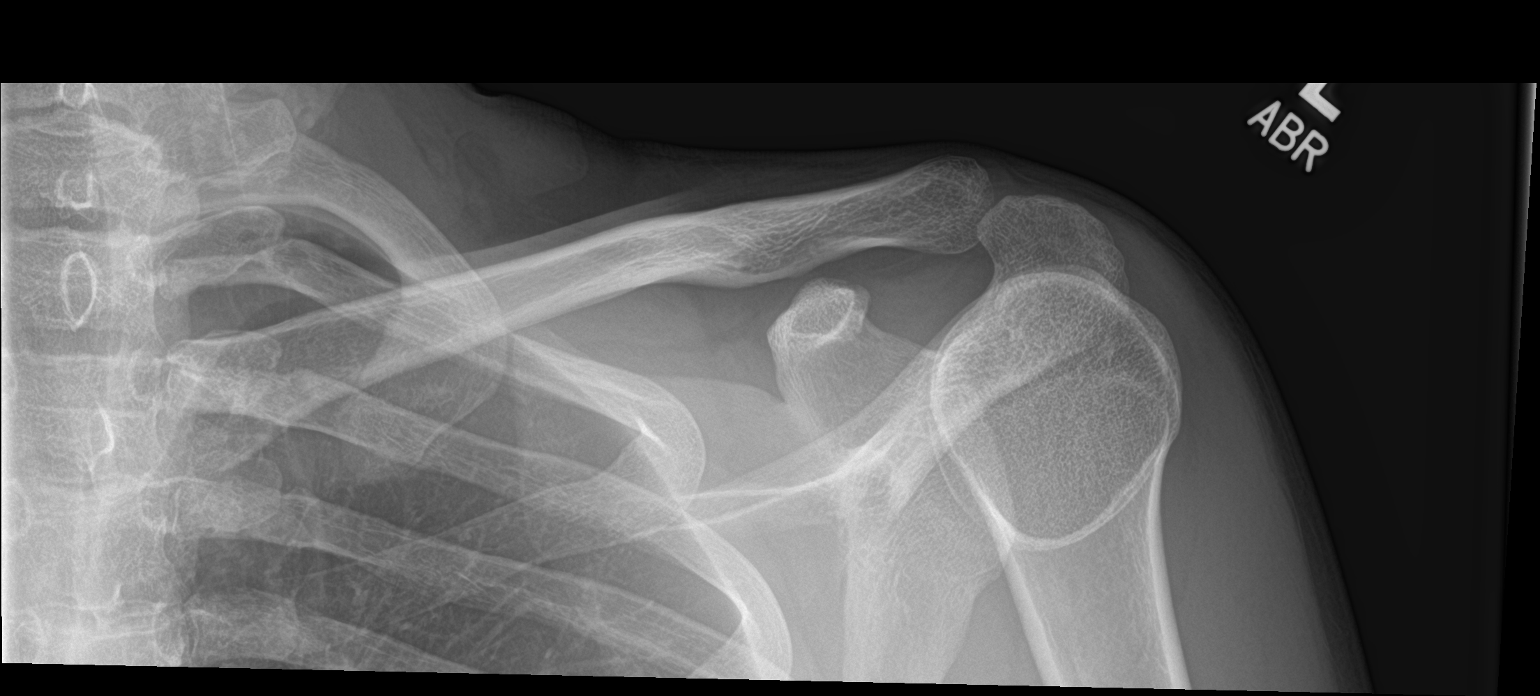

[2 of 2 positions shown; findings below may reference images not displayed]

FINDINGS: There is no evidence of fracture or other focal bone lesions. Soft
tissues are unremarkable.
IMPRESSION: Negative.
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
## 2020-11-27 ENCOUNTER — Emergency Department (HOSPITAL_COMMUNITY): Payer: BC Managed Care – PPO

## 2020-11-27 ENCOUNTER — Emergency Department (HOSPITAL_COMMUNITY)
Admission: EM | Admit: 2020-11-27 | Discharge: 2020-11-27 | Disposition: A | Discharge status: Home / Self Care | Payer: BC Managed Care – PPO | Attending: Emergency Medicine | Admitting: Emergency Medicine

## 2020-11-27 ENCOUNTER — Encounter (HOSPITAL_COMMUNITY): Payer: Self-pay | Admitting: Emergency Medicine

## 2020-11-27 ENCOUNTER — Other Ambulatory Visit: Payer: Self-pay

## 2020-11-27 DIAGNOSIS — Z79899 Other long term (current) drug therapy: Secondary | ICD-10-CM | POA: Insufficient documentation

## 2020-11-27 DIAGNOSIS — R1030 Lower abdominal pain, unspecified: Secondary | ICD-10-CM | POA: Diagnosis not present

## 2020-11-27 DIAGNOSIS — R1084 Generalized abdominal pain: Secondary | ICD-10-CM | POA: Diagnosis not present

## 2020-11-27 DIAGNOSIS — J45909 Unspecified asthma, uncomplicated: Secondary | ICD-10-CM | POA: Insufficient documentation

## 2020-11-27 DIAGNOSIS — R109 Unspecified abdominal pain: Secondary | ICD-10-CM | POA: Diagnosis not present

## 2020-11-27 LAB — BASIC METABOLIC PANEL
Anion gap: 8 (ref 5–15)
BUN: 18 mg/dL (ref 6–20)
CO2: 27 mmol/L (ref 22–32)
Calcium: 9.7 mg/dL (ref 8.9–10.3)
Chloride: 103 mmol/L (ref 98–111)
Creatinine, Ser: 0.99 mg/dL (ref 0.61–1.24)
GFR, Estimated: >60 mL/min (ref >60)
Glucose, Bld: 91 mg/dL (ref 70–99)
Potassium: 3.9 mmol/L (ref 3.5–5.1)
Sodium: 138 mmol/L (ref 135–145)

## 2020-11-27 LAB — CBC
HCT: 51.4 % (ref 39.0–52.0)
Hemoglobin: 16.9 g/dL (ref 13.0–17.0)
MCH: 29.3 pg (ref 26.0–34.0)
MCHC: 32.9 g/dL (ref 30.0–36.0)
MCV: 89.2 fL (ref 80.0–100.0)
Platelets: 199 10*3/uL (ref 150–400)
RBC: 5.76 MIL/uL (ref 4.22–5.81)
RDW: 12 % (ref 11.5–15.5)
WBC: 3.8 10*3/uL — ABNORMAL LOW (ref 4.0–10.5)
nRBC: 0 % (ref 0.0–0.2)

## 2020-11-27 MED ORDER — IOHEXOL 300 MG/ML  SOLN
100.0000 mL | Freq: Once | INTRAMUSCULAR | Status: AC | PRN
  Administered 2020-11-27: 100 mL via INTRAVENOUS
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
## 2020-11-29 ENCOUNTER — Other Ambulatory Visit: Payer: Self-pay

## 2020-11-29 NOTE — Telephone Encounter (Signed)
Last seen 07/13/20 for ADHD. Please advise. Thank you

## 2020-11-29 NOTE — Telephone Encounter (Signed)
Pt needs refill on amphetamine-dextroamphetamine (ADDERALL XR) 30 MG 24 hr capsule [[id number]] sent to [id number] - [place], [place] - [address] AT [facility] [address] pt made a med check appt for 01/14  Pt call back [phone]

## 2020-11-29 NOTE — Telephone Encounter (Signed)
Please pend I will sign 

## 2020-11-30 MED ORDER — AMPHETAMINE-DEXTROAMPHET ER 30 MG PO CP24: ORAL_CAPSULE | ORAL | 0 refills | Status: DC

## 2020-12-15 ENCOUNTER — Ambulatory Visit (INDEPENDENT_AMBULATORY_CARE_PROVIDER_SITE_OTHER): Payer: BC Managed Care – PPO | Admitting: Family Medicine

## 2020-12-15 ENCOUNTER — Encounter: Payer: Self-pay | Admitting: Family Medicine

## 2020-12-15 ENCOUNTER — Other Ambulatory Visit: Payer: Self-pay

## 2020-12-15 VITALS — BP 122/76 | Temp 97.0°F | Ht 73.0 in | Wt 144.6 lb

## 2020-12-15 DIAGNOSIS — F909 Attention-deficit hyperactivity disorder, unspecified type: Secondary | ICD-10-CM | POA: Diagnosis not present

## 2020-12-15 MED ORDER — AMPHETAMINE-DEXTROAMPHET ER 30 MG PO CP24: ORAL_CAPSULE | ORAL | 0 refills | Status: DC

## 2020-12-15 MED ORDER — AMPHETAMINE-DEXTROAMPHET ER 30 MG PO CP24: 30.0000 mg | ORAL_CAPSULE | ORAL | 0 refills | Status: DC

## 2020-12-15 NOTE — Progress Notes (Signed)
   Subjective:    Patient ID: James Carson, male    DOB: 2001-06-13, 20 y.o.   MRN: [id number]  HPI Patient arrives for a follow up on ADHD. Patient states he is doing well on current meds with no problems or concerns.  This patient has adult ADD. Takes medication responsibly. Medication does help the patient focus in be more functional. Patient relates that they are or not abusing the medication or misusing the medication. The patient understands that if they're having any negative side effects such as elevated high blood pressure severe headaches they would need stop the medication follow-up immediately. They also understand that the prescriptions are to last for 3 months then the patient will need to follow-up before having further prescriptions.  Patient compliance he takes it on the days he works sometimes on the weekend does not take it For the most part he tries to take it every day Does medication help patient function /attention better he states it does help greatly  Side effects denies side effects   Review of Systems  Constitutional: Negative for activity change.  HENT: Negative for congestion and rhinorrhea.   Respiratory: Negative for cough and shortness of breath.   Cardiovascular: Negative for chest pain.  Gastrointestinal: Negative for abdominal pain, diarrhea, nausea and vomiting.  Genitourinary: Negative for dysuria and hematuria.  Neurological: Negative for weakness and headaches.  Psychiatric/Behavioral: Negative for behavioral problems and confusion.       Objective:   Physical Exam Vitals reviewed.  Constitutional:      Appearance: He is well-nourished.  Cardiovascular:     Rate and Rhythm: Normal rate and regular rhythm.     Heart sounds: Normal heart sounds. No murmur heard.   Pulmonary:     Effort: Pulmonary effort is normal.     Breath sounds: Normal breath sounds.  Musculoskeletal:        General: No edema.  Lymphadenopathy:     Cervical: No  cervical adenopathy.  Neurological:     Mental Status: He is alert.  Psychiatric:        Behavior: Behavior normal.    We did discuss his weight is gone down some he states he will do a better job of eating more often       Assessment & Plan:  The patient was seen today as part of the visit regarding ADD.  Patient is stable on current regimen.  Appropriate prescriptions prescribed.  Medications were reviewed with the patient as well as compliance. Side effects were checked for. Discussion regarding effectiveness was held. Prescriptions were electronically sent in.  Patient reminded to follow-up in approximately 3 months.   Plans to [facility] law with drug registry was checked and verified while present with the patient. 3 scripts were sent in Patient states it does help him focus stay on task He does state he is trying to eat as best as possible He will follow-up in 3 months

## 2021-02-28 ENCOUNTER — Other Ambulatory Visit: Payer: Self-pay | Admitting: Family Medicine

## 2021-02-28 ENCOUNTER — Encounter: Payer: Self-pay | Admitting: Family Medicine

## 2021-02-28 MED ORDER — AMPHETAMINE-DEXTROAMPHET ER 30 MG PO CP24: 30.0000 mg | ORAL_CAPSULE | ORAL | 0 refills | Status: DC

## 2021-02-28 NOTE — Telephone Encounter (Signed)
Prescription was sent to the pharmacy [facility] on [address] for his medicine that can be filled on 8 April Recommend a Monday video visit somewhere later in April or first Monday of May this can be with myself or with [name]

## 2021-03-05 MED ORDER — AMPHETAMINE-DEXTROAMPHET ER 30 MG PO CP24: 30.0000 mg | ORAL_CAPSULE | ORAL | 0 refills | Status: DC

## 2021-03-05 NOTE — Addendum Note (Signed)
Addended by: Dairl Ponder on: 03/05/2021 08:58 AM   Modules accepted: Orders

## 2021-03-07 ENCOUNTER — Ambulatory Visit: Payer: BC Managed Care – PPO | Admitting: Family Medicine

## 2021-03-23 DIAGNOSIS — M546 Pain in thoracic spine: Secondary | ICD-10-CM | POA: Diagnosis not present

## 2021-03-23 DIAGNOSIS — M9902 Segmental and somatic dysfunction of thoracic region: Secondary | ICD-10-CM | POA: Diagnosis not present

## 2021-03-23 DIAGNOSIS — M542 Cervicalgia: Secondary | ICD-10-CM | POA: Diagnosis not present

## 2021-03-23 DIAGNOSIS — M9901 Segmental and somatic dysfunction of cervical region: Secondary | ICD-10-CM | POA: Diagnosis not present

## 2021-03-26 DIAGNOSIS — M9901 Segmental and somatic dysfunction of cervical region: Secondary | ICD-10-CM | POA: Diagnosis not present

## 2021-03-26 DIAGNOSIS — M546 Pain in thoracic spine: Secondary | ICD-10-CM | POA: Diagnosis not present

## 2021-03-26 DIAGNOSIS — D225 Melanocytic nevi of trunk: Secondary | ICD-10-CM | POA: Diagnosis not present

## 2021-03-26 DIAGNOSIS — M9902 Segmental and somatic dysfunction of thoracic region: Secondary | ICD-10-CM | POA: Diagnosis not present

## 2021-03-26 DIAGNOSIS — M542 Cervicalgia: Secondary | ICD-10-CM | POA: Diagnosis not present

## 2021-03-28 DIAGNOSIS — M542 Cervicalgia: Secondary | ICD-10-CM | POA: Diagnosis not present

## 2021-03-28 DIAGNOSIS — M9901 Segmental and somatic dysfunction of cervical region: Secondary | ICD-10-CM | POA: Diagnosis not present

## 2021-03-28 DIAGNOSIS — M546 Pain in thoracic spine: Secondary | ICD-10-CM | POA: Diagnosis not present

## 2021-03-28 DIAGNOSIS — M9902 Segmental and somatic dysfunction of thoracic region: Secondary | ICD-10-CM | POA: Diagnosis not present

## 2021-04-02 DIAGNOSIS — M546 Pain in thoracic spine: Secondary | ICD-10-CM | POA: Diagnosis not present

## 2021-04-02 DIAGNOSIS — M9902 Segmental and somatic dysfunction of thoracic region: Secondary | ICD-10-CM | POA: Diagnosis not present

## 2021-04-02 DIAGNOSIS — M542 Cervicalgia: Secondary | ICD-10-CM | POA: Diagnosis not present

## 2021-04-02 DIAGNOSIS — M9901 Segmental and somatic dysfunction of cervical region: Secondary | ICD-10-CM | POA: Diagnosis not present

## 2021-04-03 ENCOUNTER — Encounter: Payer: Self-pay | Admitting: Family Medicine

## 2021-04-03 ENCOUNTER — Other Ambulatory Visit: Payer: Self-pay

## 2021-04-03 ENCOUNTER — Ambulatory Visit (INDEPENDENT_AMBULATORY_CARE_PROVIDER_SITE_OTHER): Payer: BC Managed Care – PPO | Admitting: Family Medicine

## 2021-04-03 VITALS — BP 114/69 | HR 82 | Temp 98.4°F | Ht 73.0 in | Wt 153.0 lb

## 2021-04-03 DIAGNOSIS — W57XXXA Bitten or stung by nonvenomous insect and other nonvenomous arthropods, initial encounter: Secondary | ICD-10-CM

## 2021-04-03 DIAGNOSIS — S30860A Insect bite (nonvenomous) of lower back and pelvis, initial encounter: Secondary | ICD-10-CM | POA: Diagnosis not present

## 2021-04-03 MED ORDER — DOXYCYCLINE HYCLATE 100 MG PO TABS: 100.0000 mg | ORAL_TABLET | Freq: Two times a day (BID) | ORAL | 0 refills | Status: DC

## 2021-04-03 NOTE — Patient Instructions (Signed)
Tick Bite Information, Adult  Ticks are insects that can bite. Most ticks live in shrubs and grassy areas. They climb onto people and animals that go by. Then they bite. Some ticks carry germs that can make you sick. How can I prevent tick bites? Take these steps: Use insect repellent  Use an insect repellent that has 20% or higher of the ingredients DEET, picaridin, or IR3535. Follow the instructions on the label. Put it on: ? Bare skin. ? The tops of your boots. ? Your pant legs. ? The ends of your sleeves.  If you use an insect repellent that has the ingredient permethrin, follow the instructions on the label. Put it on: ? Clothing. ? Boots. ? Supplies or outdoor gear. ? Tents. When you are outside  Wear long sleeves and long pants.  Wear light-colored clothes.  Tuck your pant legs into your socks.  Stay in the middle of the trail. Do not touch the bushes.  Avoid walking through long grass.  Check for ticks on your clothes, hair, and skin often while you are outside. Before going inside your house, check your clothes, skin, head, neck, armpits, waist, groin, and joint areas. When you go indoors  Check your clothes for ticks. Dry your clothes in a dryer on high heat for 10 minutes or more. If clothes are damp, additional time may be needed.  Wash your clothes right away if they need to be washed. Use hot water.  Check your pets and outdoor gear.  Shower right away.  Check your body for ticks. Do a full body check using a mirror. What is the right way to remove a tick? Remove the tick from your skin as soon as possible. Do not remove the tick with your bare fingers.  To remove a tick that is crawling on your skin: ? Go outdoors and brush the tick off. ? Use tape or a lint roller.  To remove a tick that is biting: 1. Wash your hands. 2. If you have latex gloves, put them on. 3. Use tweezers, curved forceps, or a tick-removal tool to grasp the tick. Grasp the tick  as close to your skin and as close to the tick's head as possible. 4. Gently pull up until the tick lets go.  Try to keep the tick's head attached to its body.  Do not twist or jerk the tick.  Do not squeeze or crush the tick. Do not try to remove a tick with heat, alcohol, petroleum jelly, or fingernail polish.   What should I do after taking out a tick?  Throw away the tick. Do not crush a tick with your fingers.  Clean the bite area and your hands with soap and water, rubbing alcohol, or an iodine wash.  If an antiseptic cream or ointment is available, apply a small amount to the bite area.  Wash and disinfect any instruments that you used to remove the tick. How should I get rid of a live tick? To dispose of a live tick, use one of these methods:  Place the tick in rubbing alcohol.  Place the tick in a bag or container you can close tightly.  Wrap the tick tightly in tape.  Flush the tick down the toilet. Contact a doctor if:  You have symptoms, such as: ? A fever or chills. ? A red rash that makes a circle (bull's-eye rash) in the bite area. ? Redness and swelling where the tick bit you. ? Headache. ?  Pain in a muscle, joint, or bone. ? Being more tired than normal. ? Trouble walking or moving your legs. ? Numbness in your legs. ? Tender and swollen lymph glands.  A part of a tick breaks off and gets stuck in your skin. Get help right away if:  You cannot remove a tick.  You cannot move (have paralysis) or feel weak.  You are feeling worse or have new symptoms.  You find a tick that is biting you and filled with blood. This is important if you are in an area where diseases from ticks are common. Summary  Ticks may carry germs that can make you sick.  To prevent tick bites wear long sleeves, long pants, and light colors. Use insect repellent. Follow the instructions on the label.  If the tick is biting, do not try to remove it with heat, alcohol, petroleum  jelly, or fingernail polish.  Use tweezers, curved forceps, or a tick-removal tool to grasp the tick. Gently pull up until the tick lets go. Do not twist or jerk the tick. Do not squeeze or crush the tick.  If you have symptoms, contact a doctor. This information is not intended to replace advice given to you by your health care provider. Make sure you discuss any questions you have with your health care provider. Document Revised: 11/15/2019 Document Reviewed: 11/15/2019 Elsevier Patient Education  2021 [employer].

## 2021-04-03 NOTE — Progress Notes (Signed)
Patient ID: James Carson, male    DOB: 31-Mar-2001, 20 y.o.   MRN: [id number]   Chief Complaint  Patient presents with  . Insect Bite    Tick bite, removed yesterday from left hip - redness present   Subjective:  Cc; Tick attached  This is a new problem.  Presents today for an acute visit with complaint of tick bite.  Reports that he was in the woods on Sunday, yesterday afternoon he found a tick, it was attached greater than 24 hours.  Site of attachment was at the left hip.  He reports that the tick did have a white spot on it.  Has a history of Lyme's disease, where he is well aware of symptoms.  Denies fever, chills, chest pain, muscle aches, digestive issues currently.  Feels well.    Medical History James Carson has a past medical history of ADHD (attention deficit hyperactivity disorder), Asthma, Atypical nevi, Learning disability, and Tremors of nervous system.   Outpatient Encounter Medications as of 04/03/2021  Medication Sig  . doxycycline (VIBRA-TABS) 100 MG tablet Take 1 tablet (100 mg total) by mouth 2 (two) times daily.  Marland Kitchen amphetamine-dextroamphetamine (ADDERALL XR) 30 MG 24 hr capsule Take one capsule po every morning.  Marland Kitchen amphetamine-dextroamphetamine (ADDERALL XR) 30 MG 24 hr capsule Take one capsule po every morning  . amphetamine-dextroamphetamine (ADDERALL XR) 30 MG 24 hr capsule Take one capsule po every morning  . amphetamine-dextroamphetamine (ADDERALL XR) 30 MG 24 hr capsule Take 1 capsule (30 mg total) by mouth every morning.   No facility-administered encounter medications on file as of 04/03/2021.     Review of Systems  Constitutional: Negative for chills, fatigue and fever.  Respiratory: Negative for shortness of breath.   Cardiovascular: Negative for chest pain.  Musculoskeletal: Negative for myalgias.  Neurological: Negative for headaches.      Vitals BP 114/69   Pulse 82   Temp 98.4 F (36.9 C)   Ht 6\' 1"  (1.854 m)   Wt 153 lb (69.4 kg)    SpO2 98%   BMI 20.19 kg/m   Objective:   Physical Exam Vitals reviewed.  Constitutional:      General: He is not in acute distress.    Appearance: He is not ill-appearing or toxic-appearing.  Cardiovascular:     Rate and Rhythm: Normal rate and regular rhythm.     Heart sounds: Normal heart sounds.  Pulmonary:     Effort: Pulmonary effort is normal.     Breath sounds: Normal breath sounds.  Musculoskeletal:     Comments: Left hip with diffuse erythema noted from site of tick bite. No bulls eye rash noted today.    Skin:    General: Skin is warm and dry.  Neurological:     General: No focal deficit present.     Mental Status: He is alert.  Psychiatric:        Behavior: Behavior normal.      Assessment and Plan   1. Tick bite of pelvic region, initial encounter - doxycycline (VIBRA-TABS) 100 MG tablet; Take 1 tablet (100 mg total) by mouth 2 (two) times daily.  Dispense: 2 tablet; Refill: 0   Area of erythema is diffuse, not bull's-eye in appearance.  No symptoms today.  Will treat prophylactically after shared decision making.  Agrees with plan of care discussed today. Understands warning signs to seek further care: chest pain, shortness of breath, any significant change in health.  Understands to follow-up if symptoms  worsen, do not improve.  Has a history of Lyme's disease, aware of symptoms, will be watchful and let us know.   Pecolia Ades, NP 04/03/2021

## 2021-04-04 DIAGNOSIS — M542 Cervicalgia: Secondary | ICD-10-CM | POA: Diagnosis not present

## 2021-04-04 DIAGNOSIS — M9901 Segmental and somatic dysfunction of cervical region: Secondary | ICD-10-CM | POA: Diagnosis not present

## 2021-04-04 DIAGNOSIS — M546 Pain in thoracic spine: Secondary | ICD-10-CM | POA: Diagnosis not present

## 2021-04-04 DIAGNOSIS — D485 Neoplasm of uncertain behavior of skin: Secondary | ICD-10-CM | POA: Diagnosis not present

## 2021-04-04 DIAGNOSIS — D225 Melanocytic nevi of trunk: Secondary | ICD-10-CM | POA: Diagnosis not present

## 2021-04-04 DIAGNOSIS — M9902 Segmental and somatic dysfunction of thoracic region: Secondary | ICD-10-CM | POA: Diagnosis not present

## 2021-04-17 ENCOUNTER — Other Ambulatory Visit: Payer: Self-pay

## 2021-04-17 ENCOUNTER — Encounter: Payer: Self-pay | Admitting: Family Medicine

## 2021-04-17 ENCOUNTER — Ambulatory Visit (INDEPENDENT_AMBULATORY_CARE_PROVIDER_SITE_OTHER): Payer: BC Managed Care – PPO | Admitting: Family Medicine

## 2021-04-17 VITALS — BP 118/82 | HR 80 | Temp 98.9°F | Ht 73.0 in | Wt 155.4 lb

## 2021-04-17 DIAGNOSIS — F909 Attention-deficit hyperactivity disorder, unspecified type: Secondary | ICD-10-CM

## 2021-04-17 MED ORDER — AMPHETAMINE-DEXTROAMPHET ER 30 MG PO CP24: 30.0000 mg | ORAL_CAPSULE | ORAL | 0 refills | Status: DC

## 2021-04-17 MED ORDER — AMPHETAMINE-DEXTROAMPHET ER 30 MG PO CP24: ORAL_CAPSULE | ORAL | 0 refills | Status: DC

## 2021-04-17 NOTE — Progress Notes (Signed)
Patient ID: James Carson, male    DOB: October 08, 2001, 20 y.o.   MRN: [id number]   Chief Complaint  Patient presents with  . ADHD    Follow up   Subjective:  Cc: medication management for ADHD  This is not a new problem.  Presents today for medication management for ADHD.  Reports that he is compliant with his medication, reports that it controls his symptoms, helps him with driving back and forth to work and he is able to focus while doing his work.  Able to eat well sleeps well reports that his weight sometimes fluctuates, he is up 9 pounds since a visit here in January.  Denies fever, chills, chest pain, shortness of breath.    Medical History James Carson has a past medical history of ADHD (attention deficit hyperactivity disorder), Asthma, Atypical nevi, Learning disability, and Tremors of nervous system.   Outpatient Encounter Medications as of 04/17/2021  Medication Sig  . amphetamine-dextroamphetamine (ADDERALL XR) 30 MG 24 hr capsule Take one capsule po every morning  . amphetamine-dextroamphetamine (ADDERALL XR) 30 MG 24 hr capsule Take one capsule po every morning  . amphetamine-dextroamphetamine (ADDERALL XR) 30 MG 24 hr capsule Take 1 capsule (30 mg total) by mouth every morning.  Marland Kitchen doxycycline (VIBRA-TABS) 100 MG tablet Take 1 tablet (100 mg total) by mouth 2 (two) times daily.  . [DISCONTINUED] amphetamine-dextroamphetamine (ADDERALL XR) 30 MG 24 hr capsule Take one capsule po every morning.  . [DISCONTINUED] amphetamine-dextroamphetamine (ADDERALL XR) 30 MG 24 hr capsule Take one capsule po every morning  . [DISCONTINUED] amphetamine-dextroamphetamine (ADDERALL XR) 30 MG 24 hr capsule Take one capsule po every morning  . [DISCONTINUED] amphetamine-dextroamphetamine (ADDERALL XR) 30 MG 24 hr capsule Take 1 capsule (30 mg total) by mouth every morning.   No facility-administered encounter medications on file as of 04/17/2021.     Review of Systems  Constitutional:  Negative for chills and fever.  Respiratory: Negative for shortness of breath.   Cardiovascular: Negative for chest pain.  Neurological: Negative for dizziness, light-headedness and headaches.     Vitals BP 118/82   Pulse 80   Temp 98.9 F (37.2 C) (Oral)   Ht 6\' 1"  (1.854 m)   Wt 155 lb 6.4 oz (70.5 kg)   SpO2 97%   BMI 20.50 kg/m   Objective:   Physical Exam Vitals and nursing note reviewed.  Cardiovascular:     Rate and Rhythm: Normal rate and regular rhythm.     Heart sounds: Normal heart sounds.  Pulmonary:     Effort: Pulmonary effort is normal.     Breath sounds: Normal breath sounds.  Skin:    General: Skin is warm and dry.  Neurological:     General: No focal deficit present.     Mental Status: He is alert.  Psychiatric:        Behavior: Behavior normal.      Assessment and Plan   1. Attention deficit hyperactivity disorder (ADHD), unspecified ADHD type - amphetamine-dextroamphetamine (ADDERALL XR) 30 MG 24 hr capsule; Take one capsule po every morning  Dispense: 30 capsule; Refill: 0 - amphetamine-dextroamphetamine (ADDERALL XR) 30 MG 24 hr capsule; Take one capsule po every morning  Dispense: 30 capsule; Refill: 0 - amphetamine-dextroamphetamine (ADDERALL XR) 30 MG 24 hr capsule; Take 1 capsule (30 mg total) by mouth every morning.  Dispense: 30 capsule; Refill: 0   ADHD: Medication compliance: Takes daily as prescribed, sometimes does not take on weekends. Symptoms controlled: Reports  symptoms are controlled, helps with driving back and forth to work and helps him focus while at work. Eat/sleep/weight: Reports appetite good, sleeps well, weight occasionally fluctuates, up 9 pounds since a visit in January. Plan: Renew current prescription. Consider weaning slowly off of medication.   Agrees with plan of care discussed today. Understands warning signs to seek further care: chest pain, shortness of breath, any significant change in health.  Understands  to follow-up in three months with PCP for medication management. Consider weaning self off of medication in future. Please consult with physician before weaning, this should be a slow process to avoid side effects and withdrawal.   Pecolia Ades, NP 04/17/21

## 2021-04-17 NOTE — Patient Instructions (Signed)

## 2021-04-18 DIAGNOSIS — M9902 Segmental and somatic dysfunction of thoracic region: Secondary | ICD-10-CM | POA: Diagnosis not present

## 2021-04-18 DIAGNOSIS — M542 Cervicalgia: Secondary | ICD-10-CM | POA: Diagnosis not present

## 2021-04-18 DIAGNOSIS — M546 Pain in thoracic spine: Secondary | ICD-10-CM | POA: Diagnosis not present

## 2021-04-18 DIAGNOSIS — M9901 Segmental and somatic dysfunction of cervical region: Secondary | ICD-10-CM | POA: Diagnosis not present

## 2021-05-14 DIAGNOSIS — M9902 Segmental and somatic dysfunction of thoracic region: Secondary | ICD-10-CM | POA: Diagnosis not present

## 2021-05-14 DIAGNOSIS — M546 Pain in thoracic spine: Secondary | ICD-10-CM | POA: Diagnosis not present

## 2021-05-14 DIAGNOSIS — M542 Cervicalgia: Secondary | ICD-10-CM | POA: Diagnosis not present

## 2021-05-14 DIAGNOSIS — M9901 Segmental and somatic dysfunction of cervical region: Secondary | ICD-10-CM | POA: Diagnosis not present

## 2021-07-05 ENCOUNTER — Encounter: Payer: Self-pay | Admitting: Family Medicine

## 2021-07-09 ENCOUNTER — Ambulatory Visit
Admission: RE | Admit: 2021-07-09 | Discharge: 2021-07-09 | Disposition: A | Discharge status: Home / Self Care | Payer: BC Managed Care – PPO | Source: Ambulatory Visit | Attending: Family Medicine | Admitting: Family Medicine

## 2021-07-09 ENCOUNTER — Other Ambulatory Visit: Payer: Self-pay

## 2021-07-09 VITALS — BP 115/73 | HR 71 | Temp 98.7°F | Resp 17

## 2021-07-09 DIAGNOSIS — J029 Acute pharyngitis, unspecified: Secondary | ICD-10-CM | POA: Diagnosis not present

## 2021-07-09 DIAGNOSIS — J040 Acute laryngitis: Secondary | ICD-10-CM

## 2021-07-09 MED ORDER — PREDNISONE 20 MG PO TABS
20.0000 mg | ORAL_TABLET | Freq: Every day | ORAL | 0 refills | Status: AC
Start: 2021-07-09 — End: 2021-07-14

## 2021-07-09 NOTE — Discharge Instructions (Addendum)
Continue Sudafed and add Cetrizine or Xyzal .

## 2021-07-09 NOTE — ED Triage Notes (Signed)
Pt is present today with nasal congestion and sore throat. Pt states that his sx started yesterday.

## 2021-07-09 NOTE — ED Provider Notes (Signed)
[facility]    CSN: [id number] Arrival date & time: 07/09/21  0843      History   Chief Complaint Chief Complaint  Patient presents with   Sore Throat   Nasal Congestion    HPI James Carson is a 20 y.o. male.   HPI Patient here today with nasal congestion and sore throat.  Symptoms started abruptly x1 day ago.  He is afebrile.  Denies any exposures to anyone positive for COVID.  Patient has a history of asthma, denies any symptoms of wheezing or coughing.   Past Medical History:  Diagnosis Date   ADHD (attention deficit hyperactivity disorder)    Asthma    Atypical nevi    right shoulder   Learning disability    dyslexia   Tremors of nervous system    benign tremors-Dr. Gaynell Face    Patient Active Problem List   Diagnosis Date Noted   Tick bite of pelvic region 04/03/2021   Learning disability 07/21/2014   ADD (attention deficit disorder) 12/15/2013    Past Surgical History:  Procedure Laterality Date   CIRCUMCISION     IRRIGATION AND DEBRIDEMENT SEBACEOUS CYST     ritlumph     tubes in the ears         Home Medications    Prior to Admission medications   Medication Sig Start Date End Date Taking? Authorizing Provider  predniSONE (DELTASONE) 20 MG tablet Take 1 tablet (20 mg total) by mouth daily with breakfast for 5 days. 07/09/21 07/14/21 Yes Scot Jun, FNP  amphetamine-dextroamphetamine (ADDERALL XR) 30 MG 24 hr capsule Take one capsule po every morning 04/17/21   Chalmers Guest, NP  amphetamine-dextroamphetamine (ADDERALL XR) 30 MG 24 hr capsule Take one capsule po every morning 04/17/21   Chalmers Guest, NP  amphetamine-dextroamphetamine (ADDERALL XR) 30 MG 24 hr capsule Take 1 capsule (30 mg total) by mouth every morning. 04/17/21   Chalmers Guest, NP  doxycycline (VIBRA-TABS) 100 MG tablet Take 1 tablet (100 mg total) by mouth 2 (two) times daily. 04/03/21   Chalmers Guest, NP    Family History Family History  Problem Relation  Age of Onset   Healthy Mother    Healthy Father     Social History Social History   Tobacco Use   Smoking status: Never   Smokeless tobacco: Never  Substance Use Topics   Alcohol use: No    Alcohol/week: 0.0 standard drinks   Drug use: No     Allergies   Patient has no known allergies.   Review of Systems Review of Systems Pertinent negatives listed in HPI   Physical Exam Triage Vital Signs ED Triage Vitals  Enc Vitals Group     BP 07/09/21 0849 115/73     Pulse Rate 07/09/21 0849 71     Resp 07/09/21 0849 17     Temp 07/09/21 0850 98.7 F (37.1 C)     Temp Source 07/09/21 0849 Oral     SpO2 07/09/21 0849 98 %     Weight --      Height --      Head Circumference --      Peak Flow --      Pain Score 07/09/21 0849 5     Pain Loc --      Pain Edu? --      Excl. in [place]? --    No data found.  Updated Vital Signs BP 115/73   Pulse  71   Temp 98.7 F (37.1 C)   Resp 17   SpO2 98%   Visual Acuity Right Eye Distance:   Left Eye Distance:   Bilateral Distance:    Right Eye Near:   Left Eye Near:    Bilateral Near:     Physical Exam  General Appearance:    Alert, cooperative, no distress  HENT:   Normocephalic, ears normal, nares mucosal edema with congestion, rhinorrhea, oropharynx erythematous w/o exudate  Eyes:    PERRL, conjunctiva/corneas clear, EOM's intact       Lungs:     Clear to auscultation bilaterally, respirations unlabored  Heart:    Regular rate and rhythm  Neurologic:   Awake, alert, oriented x 3. No apparent focal neurological deficit      UC Treatments / Results  Labs (all labs ordered are listed, but only abnormal results are displayed) Labs Reviewed - No data to display  EKG   Radiology No results found.  Procedures Procedures (including critical care time)  Medications Ordered in UC Medications - No data to display  Initial Impression / Assessment and Plan / UC Course  I have reviewed the triage vital signs and the  nursing notes.  Pertinent labs & imaging results that were available during my care of the patient were reviewed by me and considered in my medical decision making (see chart for details).    Treatment per discharge instructions. Patient taken two home COVID test which both were negative. RTC PRN Final Clinical Impressions(s) / UC Diagnoses   Final diagnoses:  Sore throat  Laryngitis     Discharge Instructions      Continue Sudafed and add Cetrizine or Xyzal .     ED Prescriptions     Medication Sig Dispense Auth. Provider   predniSONE (DELTASONE) 20 MG tablet Take 1 tablet (20 mg total) by mouth daily with breakfast for 5 days. 5 tablet Scot Jun, FNP      PDMP not reviewed this encounter.   Scot Jun, [place] 07/09/21 [phone]

## 2021-07-13 ENCOUNTER — Encounter: Payer: Self-pay | Admitting: Family Medicine

## 2021-07-13 ENCOUNTER — Other Ambulatory Visit: Payer: Self-pay | Admitting: Family Medicine

## 2021-07-13 MED ORDER — AMOXICILLIN 500 MG PO CAPS
ORAL_CAPSULE | ORAL | 0 refills | Status: DC
Start: 2021-07-13 — End: 2021-08-08

## 2021-07-13 NOTE — Telephone Encounter (Signed)
Nurses  Please let him know that I read over his note  May send in prescription for [facility]  Amoxicillin 500 mg 1 taken 3 times daily for 7 days to the pharmacy Please notify Frederico Hamman this should help with the congestion/phlegm  Hopefully he will gradually get better over the next several days

## 2021-07-13 NOTE — Addendum Note (Signed)
Addended by: Vicente Males on: 07/13/2021 01:03 PM   Modules accepted: Orders

## 2021-07-18 ENCOUNTER — Ambulatory Visit: Payer: BC Managed Care – PPO | Admitting: Family Medicine

## 2021-08-08 ENCOUNTER — Ambulatory Visit (INDEPENDENT_AMBULATORY_CARE_PROVIDER_SITE_OTHER): Payer: BC Managed Care – PPO | Admitting: Family Medicine

## 2021-08-08 ENCOUNTER — Encounter: Payer: Self-pay | Admitting: Family Medicine

## 2021-08-08 ENCOUNTER — Other Ambulatory Visit: Payer: Self-pay

## 2021-08-08 VITALS — BP 118/74 | Ht 73.0 in | Wt 150.8 lb

## 2021-08-08 DIAGNOSIS — F909 Attention-deficit hyperactivity disorder, unspecified type: Secondary | ICD-10-CM

## 2021-08-08 MED ORDER — AMPHETAMINE-DEXTROAMPHET ER 30 MG PO CP24: ORAL_CAPSULE | ORAL | 0 refills | Status: DC

## 2021-08-08 MED ORDER — AMPHETAMINE-DEXTROAMPHET ER 30 MG PO CP24: 30.0000 mg | ORAL_CAPSULE | ORAL | 0 refills | Status: DC

## 2021-08-08 NOTE — Patient Instructions (Signed)
3 prescriptions will be sent in today to the [facility] [address]  Please follow-up toward the end of December or the start of January  When you need an additional prescription for December please call us very appropriate thanks

## 2021-08-08 NOTE — Progress Notes (Signed)
   Subjective:    Patient ID: James Carson, male    DOB: 09-09-2001, 20 y.o.   MRN: [id number]  HPI Patient has adult ADD This patient has adult ADD. Takes medication responsibly. Medication does help the patient focus in be more functional. Patient relates that they are or not abusing the medication or misusing the medication. The patient understands that if they're having any negative side effects such as elevated high blood pressure severe headaches they would need stop the medication follow-up immediately. They also understand that the prescriptions are to last for 3 months then the patient will need to follow-up before having further prescriptions.  Patient compliance relates he takes it most days occasionally on the weekends  Does medication help patient function /attention better does help him function better at work  Side effects he denies side effects  Patient arrives for a follow up on ADHD. Patient currently on Adderall XR '30mg'$  and doing well with no problems.  Review of Systems     Objective:   Physical Exam  General-in no acute distress Eyes-no discharge Lungs-respiratory rate normal, CTA CV-no murmurs,RRR Extremities skin warm dry no edema Neuro grossly normal Behavior normal, alert       Assessment & Plan:  He is working a full-time job has difficult time staying focused and on task and because of this needs some medication with the medicine he can stay more on task able to do his job He denies any auto accidents he states he is trying to be safe he also states he is taking his medication on regular basis per above he will follow-up in approximately 4 months

## 2021-09-10 ENCOUNTER — Encounter: Payer: Self-pay | Admitting: Family Medicine

## 2021-09-10 NOTE — Telephone Encounter (Signed)
James Carson  A letter was dictated please make sure that he gets this through [place] thank you-Dr. Nicki Reaper

## 2021-10-03 ENCOUNTER — Other Ambulatory Visit: Payer: Self-pay

## 2021-10-03 ENCOUNTER — Ambulatory Visit
Admission: EM | Admit: 2021-10-03 | Discharge: 2021-10-03 | Discharge status: Absent without leave | Payer: BC Managed Care – PPO

## 2021-10-03 ENCOUNTER — Encounter: Payer: Self-pay | Admitting: Family Medicine

## 2021-10-03 ENCOUNTER — Ambulatory Visit (INDEPENDENT_AMBULATORY_CARE_PROVIDER_SITE_OTHER): Payer: BC Managed Care – PPO | Admitting: Family Medicine

## 2021-10-03 VITALS — Temp 97.7°F | Wt 151.2 lb

## 2021-10-03 DIAGNOSIS — R5383 Other fatigue: Secondary | ICD-10-CM | POA: Diagnosis not present

## 2021-10-03 MED ORDER — OSELTAMIVIR PHOSPHATE 75 MG PO CAPS: 75.0000 mg | ORAL_CAPSULE | Freq: Two times a day (BID) | ORAL | 0 refills | Status: DC

## 2021-10-03 NOTE — Telephone Encounter (Signed)
Pt has been scheduled for 3:50 today.

## 2021-10-03 NOTE — Telephone Encounter (Signed)
Nurses-May give side door visit at 3:50 PM today

## 2021-10-03 NOTE — Progress Notes (Signed)
[employer]  Subjective:    Patient ID: James Carson, male    DOB: [birth year], 20 y.o.   MRN: [id number]  HPI Pt has had headache and body weakness since yesterday. When going in to work pt felt tired and had no energy. Parents recently had COVID. Dad was treated for Flu; pt took a Tamiflu   He denies any wheezing difficulty breathing denies nausea or vomiting just relates headache just did not feel good he states he took 1 Tamiflu because his parents had the flu Review of Systems     Objective:   Physical Exam  Does not appear in any distress lungs clear heart regular HEENT benign temperature is normal skin eardrums normal  Work excuse given for today    Assessment & Plan:  Viral syndrome Triple swab taken Supportive measures discussed Follow-up if progressive troubles or if worse

## 2021-10-04 ENCOUNTER — Ambulatory Visit: Payer: Self-pay

## 2021-10-04 LAB — COVID-19, FLU A+B AND RSV
Influenza A, NAA: NOT DETECTED
Influenza B, NAA: NOT DETECTED
RSV, NAA: NOT DETECTED
SARS-CoV-2, NAA: NOT DETECTED

## 2021-11-21 ENCOUNTER — Encounter: Payer: Self-pay | Admitting: Family Medicine

## 2021-11-22 ENCOUNTER — Other Ambulatory Visit: Payer: Self-pay | Admitting: Family Medicine

## 2021-11-22 ENCOUNTER — Encounter: Payer: Self-pay | Admitting: Family Medicine

## 2021-11-22 DIAGNOSIS — F909 Attention-deficit hyperactivity disorder, unspecified type: Secondary | ICD-10-CM

## 2021-11-23 ENCOUNTER — Other Ambulatory Visit: Payer: Self-pay | Admitting: Family Medicine

## 2021-11-23 DIAGNOSIS — F909 Attention-deficit hyperactivity disorder, unspecified type: Secondary | ICD-10-CM

## 2021-11-23 MED ORDER — AMPHETAMINE-DEXTROAMPHET ER 30 MG PO CP24: ORAL_CAPSULE | ORAL | 0 refills | Status: DC

## 2021-11-23 NOTE — Telephone Encounter (Signed)
Patient notified and has follow up visit scheduled 12/07/21 with Dr Nicki Reaper.

## 2021-11-23 NOTE — Telephone Encounter (Signed)
Fill medication was sent into the pharmacy for to be filled on Tuesday.  That is the earliest they can be done.  Does need to schedule follow-up visit for January to get additional prescriptions highly recommend that the patient go ahead and schedule currently

## 2021-12-07 ENCOUNTER — Other Ambulatory Visit: Payer: Self-pay

## 2021-12-07 ENCOUNTER — Telehealth: Payer: Self-pay | Admitting: *Deleted

## 2021-12-07 ENCOUNTER — Ambulatory Visit (INDEPENDENT_AMBULATORY_CARE_PROVIDER_SITE_OTHER): Payer: BC Managed Care – PPO | Admitting: Family Medicine

## 2021-12-07 DIAGNOSIS — F909 Attention-deficit hyperactivity disorder, unspecified type: Secondary | ICD-10-CM | POA: Diagnosis not present

## 2021-12-07 MED ORDER — AMPHETAMINE-DEXTROAMPHET ER 30 MG PO CP24: ORAL_CAPSULE | ORAL | 0 refills | Status: DC

## 2021-12-07 MED ORDER — AMPHETAMINE-DEXTROAMPHET ER 30 MG PO CP24: 30.0000 mg | ORAL_CAPSULE | ORAL | 0 refills | Status: DC

## 2021-12-07 NOTE — Telephone Encounter (Signed)
Mr. James Carson, James Carson are scheduled for a virtual visit with your provider today.    Just as we do with appointments in the office, we must obtain your consent to participate.  Your consent will be active for this visit and any virtual visit you may have with one of our providers in the next 365 days.    If you have a MyChart account, I can also send a copy of this consent to you electronically.  All virtual visits are billed to your insurance company just like a traditional visit in the office.  As this is a virtual visit, video technology does not allow for your provider to perform a traditional examination.  This may limit your provider's ability to fully assess your condition.  If your provider identifies any concerns that need to be evaluated in person or the need to arrange testing such as labs, EKG, etc, we will make arrangements to do so.    Although advances in technology are sophisticated, we cannot ensure that it will always work on either your end or our end.  If the connection with a video visit is poor, we may have to switch to a telephone visit.  With either a video or telephone visit, we are not always able to ensure that we have a secure connection.   I need to obtain your verbal consent now.   Are you willing to proceed with your visit today?   James Carson has provided verbal consent on 12/07/2021 for a virtual visit (video or telephone).   Ned Card, LPN 0/08/3817  2:99 AM

## 2021-12-07 NOTE — Progress Notes (Addendum)
° °  Subjective:    Patient ID: James Carson, male    DOB: [birth year], 21 y.o.   MRN: [id number]  HPI This patient has adult ADD. Takes medication responsibly. Medication does help the patient focus in be more functional. Patient relates that they are or not abusing the medication or misusing the medication. The patient understands that if they're having any negative side effects such as elevated high blood pressure severe headaches they would need stop the medication follow-up immediately. They also understand that the prescriptions are to last for 3 months then the patient will need to follow-up before having further prescriptions.  Patient compliance Yes  Does medication help patient function /attention better  Yes Overall he is doing well with his medicine He does state it benefits him He would like to continue this  Side effects  None  Virtual Visit via Telephone Note  I connected with James Carson on 12/07/21 at 10:40 AM EST by telephone and verified that I am speaking with the correct person using two identifiers.  Location: Patient: home Provider: office   I discussed the limitations, risks, security and privacy concerns of performing an evaluation and management service by telephone and the availability of in person appointments. I also discussed with the patient that there may be a patient responsible charge related to this service. The patient expressed understanding and agreed to proceed.   History of Present Illness:    Observations/Objective:   Assessment and Plan:   Follow Up Instructions:    I discussed the assessment and treatment plan with the patient. The patient was provided an opportunity to ask questions and all were answered. The patient agreed with the plan and demonstrated an understanding of the instructions.   The patient was advised to call back or seek an in-person evaluation if the symptoms worsen or if the condition fails to improve as  anticipated.  I provided 15 minutes of non-face-to-face time during this encounter.   Review of Systems     Objective:   Physical Exam Today's visit was via telephone Physical exam was not possible for this visit        Assessment & Plan:   The patient was seen today as part of the visit regarding ADD.  Patient is stable on current regimen.  Appropriate prescriptions prescribed.  Medications were reviewed with the patient as well as compliance. Side effects were checked for. Discussion regarding effectiveness was held. Prescriptions were electronically sent in.  Patient reminded to follow-up in approximately 3 months.   Plans to [facility] law with drug registry was checked and verified while present with the patient. 3 scripts were sent in At the end of 3 months if he is doing well we will send in a fourth additional prescription and then do a follow-up visit after that

## 2021-12-21 ENCOUNTER — Ambulatory Visit: Payer: Self-pay

## 2021-12-21 ENCOUNTER — Encounter: Payer: Self-pay | Admitting: Family Medicine

## 2021-12-21 ENCOUNTER — Ambulatory Visit (INDEPENDENT_AMBULATORY_CARE_PROVIDER_SITE_OTHER): Payer: BC Managed Care – PPO | Admitting: Family Medicine

## 2021-12-21 ENCOUNTER — Other Ambulatory Visit: Payer: Self-pay

## 2021-12-21 DIAGNOSIS — J0110 Acute frontal sinusitis, unspecified: Secondary | ICD-10-CM

## 2021-12-21 DIAGNOSIS — J019 Acute sinusitis, unspecified: Secondary | ICD-10-CM | POA: Insufficient documentation

## 2021-12-21 MED ORDER — AMOXICILLIN-POT CLAVULANATE 875-125 MG PO TABS: 1.0000 | ORAL_TABLET | Freq: Two times a day (BID) | ORAL | 0 refills | Status: DC

## 2021-12-21 NOTE — Assessment & Plan Note (Signed)
Treating with Augmentin.  Work note given.

## 2021-12-21 NOTE — Progress Notes (Signed)
Subjective:  Patient ID: James Carson, male    DOB: June 30, 2001  Age: 21 y.o. MRN: [id number]  CC: Chief Complaint  Patient presents with   Sinus Problem    Started over weekend    HPI:  21 year old male presents for evaluation the above.  Patient reports that his symptoms started last weekend.  Have continued to persist.  He reports headaches particularly in the left frontal region.  Also reports body aches.  COVID testing negative at home.  Also reports sinus pain/pressure.  Has had some runny nose as well.  No fever.  Over-the-counter Tylenol helps with his discomfort but has not resolved his symptoms.  No other complaints or concerns at this time.  Patient Active Problem List   Diagnosis Date Noted   Acute sinusitis 12/21/2021   Learning disability 07/21/2014   ADD (attention deficit disorder) 12/15/2013    Social Hx   Social History   Socioeconomic History   Marital status: Single    Spouse name: Not on file   Number of children: Not on file   Years of education: Not on file   Highest education level: Not on file  Occupational History   Not on file  Tobacco Use   Smoking status: Never   Smokeless tobacco: Never  Substance and Sexual Activity   Alcohol use: No    Alcohol/week: 0.0 standard drinks   Drug use: No   Sexual activity: Not on file  Other Topics Concern   Not on file  Social History Narrative   Not on file   Social Determinants of Health   Financial Resource Strain: Not on file  Food Insecurity: Not on file  Transportation Needs: Not on file  Physical Activity: Not on file  Stress: Not on file  Social Connections: Not on file    Review of Systems Per HPI  Objective:  BP 114/72    Pulse 82    Temp 98.4 F (36.9 C) (Oral)    Ht 6\' 1"  (1.854 m)    Wt 150 lb (68 kg)    SpO2 99%    BMI 19.79 kg/m   BP/Weight 12/21/2021 92/03/2682 03/02/9621  Systolic BP 297 - 989  Diastolic BP 72 - 74  Wt. (Lbs) 150 151.2 150.8  BMI 19.79 19.95 19.9     Physical Exam Vitals and nursing note reviewed.  Constitutional:      General: He is not in acute distress.    Appearance: Normal appearance. He is not ill-appearing.  HENT:     Head: Normocephalic and atraumatic.     Comments: Tenderness over the left frontal sinus.    Right Ear: Tympanic membrane normal.     Left Ear: Tympanic membrane normal.  Cardiovascular:     Rate and Rhythm: Normal rate and regular rhythm.  Pulmonary:     Effort: Pulmonary effort is normal.     Breath sounds: Normal breath sounds. No wheezing, rhonchi or rales.  Neurological:     Mental Status: He is alert.  Psychiatric:        Mood and Affect: Mood normal.        Behavior: Behavior normal.    Lab Results  Component Value Date   WBC 3.8 (L) 11/27/2020   HGB 16.9 11/27/2020   HCT 51.4 11/27/2020   PLT 199 11/27/2020   GLUCOSE 91 11/27/2020   NA 138 11/27/2020   K 3.9 11/27/2020   CL 103 11/27/2020   CREATININE 0.99 11/27/2020   BUN 18  11/27/2020   CO2 27 11/27/2020   HGBA1C 4.9 10/26/2013     Assessment & Plan:   Problem List Items Addressed This Visit       Respiratory   Acute sinusitis    Treating with Augmentin.  Work note given.      Relevant Medications   amoxicillin-clavulanate (AUGMENTIN) 875-125 MG tablet    Meds ordered this encounter  Medications   amoxicillin-clavulanate (AUGMENTIN) 875-125 MG tablet    Sig: Take 1 tablet by mouth 2 (two) times daily.    Dispense:  20 tablet    Refill:  [place]

## 2021-12-21 NOTE — Patient Instructions (Signed)
Tylenol and Ibuprofen as needed.  Antibiotic as prescribed.  Take care  Dr. Lacinda Axon

## 2021-12-24 ENCOUNTER — Other Ambulatory Visit: Payer: Self-pay | Admitting: Family Medicine

## 2021-12-24 ENCOUNTER — Other Ambulatory Visit: Payer: Self-pay

## 2021-12-24 ENCOUNTER — Ambulatory Visit: Payer: BC Managed Care – PPO | Admitting: Family Medicine

## 2021-12-24 VITALS — BP 118/76 | HR 95 | Temp 98.5°F | Wt 153.6 lb

## 2021-12-24 DIAGNOSIS — R55 Syncope and collapse: Secondary | ICD-10-CM | POA: Insufficient documentation

## 2021-12-24 MED ORDER — DOXYCYCLINE HYCLATE 100 MG PO TABS: 100.0000 mg | ORAL_TABLET | Freq: Two times a day (BID) | ORAL | 0 refills | Status: DC

## 2021-12-24 NOTE — Patient Instructions (Signed)
Hold off on the Doxycycline.  Rest and lots of fluids.  Take care  Dr. Lacinda Axon

## 2021-12-24 NOTE — Progress Notes (Signed)
Patient having diarrhea from Augmentin and wishes to change antibiotic therapy. Stopping Augmentin. Starting Doxycycline.  [place]

## 2021-12-24 NOTE — Progress Notes (Signed)
Subjective:  Patient ID: James Carson, male    DOB: May 18, 2001  Age: 21 y.o. MRN: [id number]  CC: Chief Complaint  Patient presents with   Fatigue    Body aches, fatigue, breaking out in sweats, arms/hands tingling unable to feel them    HPI:  21 year old male presents with multiple complaints.  Patient seen 1/20.  Treated for sinusitis with Augmentin.  Patient sent a message this morning reporting that he developed GI side effects from Augmentin.  I advised him to stop medication.  Patient states that today he went to work and did not feel well.  He states that he had relatively sudden onset sweating, feeling shaky, feeling fatigued, experiencing numbness and tingling of his arms and hands, and feeling as if he was going to pass out.  He feels somewhat improved but still does not feel like his normal self.  He recently traveled to [place] to see his girlfriend.  He states that they went to the movie theater but he did not go anywhere else.  No reported sick contacts.  No fever.  He denies any respiratory symptoms at this time.  Patient Active Problem List   Diagnosis Date Noted   Pre-syncope 12/24/2021   Acute sinusitis 12/21/2021   Learning disability 07/21/2014   ADD (attention deficit disorder) 12/15/2013    Social Hx   Social History   Socioeconomic History   Marital status: Single    Spouse name: Not on file   Number of children: Not on file   Years of education: Not on file   Highest education level: Not on file  Occupational History   Not on file  Tobacco Use   Smoking status: Never   Smokeless tobacco: Never  Substance and Sexual Activity   Alcohol use: No    Alcohol/week: 0.0 standard drinks   Drug use: No   Sexual activity: Not on file  Other Topics Concern   Not on file  Social History Narrative   Not on file   Social Determinants of Health   Financial Resource Strain: Not on file  Food Insecurity: Not on file  Transportation Needs: Not  on file  Physical Activity: Not on file  Stress: Not on file  Social Connections: Not on file    Review of Systems Per HPI  Objective:  BP 118/76    Pulse 95    Temp 98.5 F (36.9 C)    Wt 153 lb 9.6 oz (69.7 kg)    SpO2 99%    BMI 20.27 kg/m   BP/Weight 12/24/2021 12/21/2021 46/08/6294  Systolic BP 284 132 -  Diastolic BP 76 72 -  Wt. (Lbs) 153.6 150 151.2  BMI 20.27 19.79 19.95    Physical Exam Constitutional:      General: He is not in acute distress.    Appearance: Normal appearance.  HENT:     Head: Normocephalic and atraumatic.  Neurological:     Mental Status: He is alert.    Lab Results  Component Value Date   WBC 3.8 (L) 11/27/2020   HGB 16.9 11/27/2020   HCT 51.4 11/27/2020   PLT 199 11/27/2020   GLUCOSE 91 11/27/2020   NA 138 11/27/2020   K 3.9 11/27/2020   CL 103 11/27/2020   CREATININE 0.99 11/27/2020   BUN 18 11/27/2020   CO2 27 11/27/2020   HGBA1C 4.9 10/26/2013     Assessment & Plan:   Problem List Items Addressed This Visit  Cardiovascular and Mediastinum   Pre-syncope - Primary    Patient with sudden onset symptoms of presyncope, numbness and tingling, fatigue, blurry vision, sweatiness.  His exam is normal here.  He has traveled recently.  Swabbed for COVID and influenza.  I believe that patient experienced a panic attack.  Advised rest.  Awaiting test results.  Work note given.      Relevant Orders   COVID-19, Flu A+B and RSV   [place] DO [place]

## 2021-12-24 NOTE — Assessment & Plan Note (Signed)
Patient with sudden onset symptoms of presyncope, numbness and tingling, fatigue, blurry vision, sweatiness.  His exam is normal here.  He has traveled recently.  Swabbed for COVID and influenza.  I believe that patient experienced a panic attack.  Advised rest.  Awaiting test results.  Work note given.

## 2021-12-25 LAB — COVID-19, FLU A+B AND RSV
Influenza A, NAA: NOT DETECTED
Influenza B, NAA: NOT DETECTED
RSV, NAA: NOT DETECTED
SARS-CoV-2, NAA: NOT DETECTED

## 2021-12-25 LAB — SPECIMEN STATUS REPORT

## 2021-12-26 DIAGNOSIS — M546 Pain in thoracic spine: Secondary | ICD-10-CM | POA: Diagnosis not present

## 2021-12-26 DIAGNOSIS — M542 Cervicalgia: Secondary | ICD-10-CM | POA: Diagnosis not present

## 2021-12-26 DIAGNOSIS — M9901 Segmental and somatic dysfunction of cervical region: Secondary | ICD-10-CM | POA: Diagnosis not present

## 2021-12-26 DIAGNOSIS — M9902 Segmental and somatic dysfunction of thoracic region: Secondary | ICD-10-CM | POA: Diagnosis not present

## 2022-03-25 ENCOUNTER — Other Ambulatory Visit: Payer: Self-pay | Admitting: Family Medicine

## 2022-03-25 ENCOUNTER — Encounter: Payer: Self-pay | Admitting: Family Medicine

## 2022-03-25 DIAGNOSIS — F909 Attention-deficit hyperactivity disorder, unspecified type: Secondary | ICD-10-CM

## 2022-03-25 MED ORDER — AMPHETAMINE-DEXTROAMPHET ER 30 MG PO CP24: 30.0000 mg | ORAL_CAPSULE | ORAL | 0 refills | Status: DC

## 2022-04-01 ENCOUNTER — Other Ambulatory Visit: Payer: Self-pay | Admitting: Family Medicine

## 2022-04-01 NOTE — Telephone Encounter (Signed)
Hi to all ? ?So there are options ?Vyvanse ?Metadate CD ? ?Are both long-acting medications for ADD.  They can be utilized in place of Adderall.  We would try to estimate the appropriate milligrams of these medicines.  More than likely both of these are covered by your insurance but you may want to be proactive and talk with your insurance company to find out which long-acting ADD medicines are covered. ? ?As soon as you decide regarding whether or not you are sticking with Adderall versus other medicine please let us know so we can make the appropriate changes ? ?Thanks-Dr. Nicki Reaper ?

## 2022-04-03 ENCOUNTER — Other Ambulatory Visit: Payer: Self-pay | Admitting: Family Medicine

## 2022-04-03 MED ORDER — METHYLPHENIDATE HCL ER (CD) 40 MG PO CPCR: 40.0000 mg | ORAL_CAPSULE | ORAL | 0 refills | Status: DC

## 2022-04-04 ENCOUNTER — Other Ambulatory Visit: Payer: Self-pay | Admitting: Family Medicine

## 2022-04-04 MED ORDER — LISDEXAMFETAMINE DIMESYLATE 40 MG PO CAPS: 40.0000 mg | ORAL_CAPSULE | ORAL | 0 refills | Status: DC

## 2022-04-10 ENCOUNTER — Telehealth: Payer: Self-pay | Admitting: *Deleted

## 2022-04-10 ENCOUNTER — Encounter: Payer: Self-pay | Admitting: Family Medicine

## 2022-04-10 NOTE — Telephone Encounter (Signed)
Prior Authorization for Vyvanse was denied-see denial ? ?The medication is only covered when the member has tried and failed 3 generic formulary alternatives aor the member is unable to take generic formularies due to interactions ao allergies ?Patient has only tried and failed Amphetamine/Dexroamphetamine XR capsules  ? ? ?Alternative medications on formulary include : Dexmethylphenidate ER capsules(generic Focalin XR) , Methylphenidate ER(generic Concerta) , Generic Metadate CD, Generic Ritalin LA ? ? ?Please advise ?

## 2022-04-11 ENCOUNTER — Other Ambulatory Visit: Payer: Self-pay | Admitting: Family Medicine

## 2022-04-11 MED ORDER — DEXMETHYLPHENIDATE HCL ER 20 MG PO CP24: 20.0000 mg | ORAL_CAPSULE | Freq: Every day | ORAL | 0 refills | Status: DC

## 2022-04-11 NOTE — Telephone Encounter (Signed)
LMTRC

## 2022-04-11 NOTE — Telephone Encounter (Signed)
Spoke to PT mother Lenda Kelp is out of stock at [facility] she is going to call around and see where she can locate the medication and call back with updated pharmacy to have medication sent to. 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  ?

## 2022-04-11 NOTE — Telephone Encounter (Signed)
Focalin was sent in.  I did send patient a MyChart message FYI thank you ?

## 2022-04-12 ENCOUNTER — Other Ambulatory Visit: Payer: Self-pay | Admitting: Family Medicine

## 2022-04-12 MED ORDER — DEXMETHYLPHENIDATE HCL ER 20 MG PO CP24: 20.0000 mg | ORAL_CAPSULE | Freq: Every day | ORAL | 0 refills | Status: DC

## 2022-04-12 NOTE — Telephone Encounter (Signed)
This was sent to [facility] I already sent message to mother regarding this information via his MyChart ?

## 2022-04-23 ENCOUNTER — Ambulatory Visit: Payer: BC Managed Care – PPO | Admitting: Family Medicine

## 2022-04-23 VITALS — BP 116/68 | HR 97 | Temp 98.2°F | Ht 73.0 in | Wt 160.0 lb

## 2022-04-23 DIAGNOSIS — F909 Attention-deficit hyperactivity disorder, unspecified type: Secondary | ICD-10-CM

## 2022-04-23 MED ORDER — DEXMETHYLPHENIDATE HCL ER 15 MG PO CP24: 15.0000 mg | ORAL_CAPSULE | Freq: Every day | ORAL | 0 refills | Status: DC

## 2022-04-23 NOTE — Progress Notes (Signed)
   Subjective:    Patient ID: James Carson, male    DOB: [birth year], 21 y.o.   MRN: [id number]  HPI  This patient has adult ADD. Takes medication responsibly. Medication does help the patient focus in be more functional. Patient relates that they are or not abusing the medication or misusing the medication. The patient understands that if they're having any negative side effects such as elevated high blood pressure severe headaches they would need stop the medication follow-up immediately. They also understand that the prescriptions are to last for 3 months then the patient will need to follow-up before having further prescriptions.  Patient compliance 1 daily   Does medication help patient function /attention better  yes better with appetite on vyvanse Patient states it does help his focus He states he is driving well Not forgetting medicine Feels like the medicine is doing well but it is taking his appetite Side patient effects appetite suppression some Significant challenges getting his medication because of insurance issues Adderall caused him agitation and side effects Therefore we have tried various other measures Vyvanse currently not covered unless other generics do not work for him Review of Systems     Objective:   Physical Exam General-in no acute distress Eyes-no discharge Lungs-respiratory rate normal, CTA CV-no murmurs,RRR Extremities skin warm dry no edema Neuro grossly normal Behavior normal, alert        Assessment & Plan:  Adult ADD Because of his insurance and had to switch around medicines He has also had intolerance of medicines Current medication he states is taking away his diet We discussed reducing the dosage Sent in new prescription of Focalin extended release 15 mg He will give [place] feedback in the next 2 to 3 weeks how this is doing If it does not do well there are other generics his insurance would cover which is detailed in a MyChart  message previously Patient to give [place] update in the next few weeks We can send in additional prescriptions Follow-up within 4 months

## 2022-04-23 NOTE — Patient Instructions (Signed)
Hi James Carson  It was a pleasure seeing you today. I did send in the adjusted dose to [facility] in [place]  Please try this over the next 2 to 3 weeks and then give me feedback on whether or not this is helping in regards to the ADD and in regards to your appetite  Feel free to reach out with any questions or concerns  Once again send me follow-up in 2 to 3 weeks  We will see you back in 3 to 4 months TakeCare-Dr. Nicki Reaper

## 2022-04-25 ENCOUNTER — Encounter: Payer: Self-pay | Admitting: Family Medicine

## 2022-04-25 NOTE — Telephone Encounter (Signed)
Hi Many of these medications work on the same pathway in the same receptor sites  Often it is more of a matter of finding the right milligram dosage  I am open to trying a different medicine but with the insurance stipulations there are limited choices  Over the next several days I will be reviewing the other options then potentially Friday or potentially Tuesday we will get back to you regarding this issue  Thank you-Dr. Horald Pollen may share the above message with family thank you

## 2022-05-03 ENCOUNTER — Other Ambulatory Visit: Payer: Self-pay | Admitting: Family Medicine

## 2022-05-03 MED ORDER — METHYLPHENIDATE HCL ER (OSM) 18 MG PO TBCR: 18.0000 mg | EXTENDED_RELEASE_TABLET | Freq: Every day | ORAL | 0 refills | Status: DC

## 2022-05-03 MED ORDER — LISDEXAMFETAMINE DIMESYLATE 40 MG PO CAPS: 40.0000 mg | ORAL_CAPSULE | ORAL | 0 refills | Status: DC

## 2022-05-28 ENCOUNTER — Other Ambulatory Visit: Payer: Self-pay | Admitting: Family Medicine

## 2022-05-29 ENCOUNTER — Telehealth: Payer: Self-pay | Admitting: Family Medicine

## 2022-05-29 ENCOUNTER — Other Ambulatory Visit: Payer: Self-pay | Admitting: Family Medicine

## 2022-05-29 NOTE — Telephone Encounter (Signed)
Spoke to pt's mom and she stated that they did get the Vyvanse filled and it cost $1,374.00. She says pt's insurance will not pay for the Vyvanse.  Pt will have to fail 3 formulary's before his insurance will consider covering the Vyvanse, that is why she was wanting the Concerta to be sent in so he could try that to see if it would work or if he would fail on that medication.  She says the Vyvanse is working good for the pt.  Please advise. Thank you

## 2022-05-29 NOTE — Telephone Encounter (Signed)
Nurses Please see telephone note that I sent to you regarding this patient There are regulatory issues that come into play Please talk with mother regarding this Thanks-Dr. Nicki Reaper

## 2022-05-29 NOTE — Telephone Encounter (Signed)
Nurses Please see patient's most recent MyChart message written by the mother She is requesting Concerta to be sent to [facility] When I went to do the prescription the PDMP(which we always have to check before prescribing controlled medications) indicated that Vyvanse 90 pills was filled on 1 June at [facility] a person has gotten 90 pills of ADD medicine-essentially 90 days worth-then we are not supposed to send in additional ADD medicine until the next prescription is due So I am confused by the request? Did they not get the Vyvanse at the start of June? If family states they did not get the medication dispensed to them please call [place] to confirm this issue Then after all of this is sorted out let me know what we need to do thank you

## 2022-05-29 NOTE — Telephone Encounter (Signed)
Nurses Unfortunately there are also issues regarding controlled medicines  From a regulatory standpoint if a person is being prescribed controlled medicine I cannot prescribe the second controlled medicine that works on the same area at the same time In other words I could send in Concerta closer to the time that he is finishing up with his current dosing of Concerta but when a person has 90 days of ADD medicine I cannot prescribe an additional 30 days during that time From a regulatory standpoint it would appear that I would be over prescribing or providing additional controlled medicines when they were indicated or needed so I would say we could send in the Concerta more toward later August and if he failed that then we should be able to meet the criteria regarding his insurance company  (I am more than happy to do an office visit at some point in time or virtual visit to help explain this further but I cannot prescribe currently based on the above reasons)

## 2022-05-29 NOTE — Telephone Encounter (Signed)
LMTRC

## 2022-05-29 NOTE — Telephone Encounter (Signed)
Sent my chart message also

## 2022-05-30 NOTE — Telephone Encounter (Signed)
Pt mom returned call and verbalized understanding. Mom has sent up telephone visit with PCP for 06/03/22 at 11:40 to discuss further.

## 2022-06-03 ENCOUNTER — Telehealth: Payer: BC Managed Care – PPO | Admitting: Family Medicine

## 2022-07-02 ENCOUNTER — Ambulatory Visit
Admission: EM | Admit: 2022-07-02 | Discharge: 2022-07-02 | Disposition: A | Discharge status: Home / Self Care | Payer: BC Managed Care – PPO | Attending: Nurse Practitioner | Admitting: Nurse Practitioner

## 2022-07-02 ENCOUNTER — Encounter: Payer: Self-pay | Admitting: Emergency Medicine

## 2022-07-02 DIAGNOSIS — J309 Allergic rhinitis, unspecified: Secondary | ICD-10-CM

## 2022-07-02 DIAGNOSIS — R0989 Other specified symptoms and signs involving the circulatory and respiratory systems: Secondary | ICD-10-CM

## 2022-07-02 MED ORDER — FLUTICASONE PROPIONATE 50 MCG/ACT NA SUSP: 2.0000 | Freq: Every day | NASAL | 0 refills | Status: DC

## 2022-07-02 MED ORDER — PSEUDOEPH-BROMPHEN-DM 30-2-10 MG/5ML PO SYRP: 5.0000 mL | ORAL_SOLUTION | Freq: Four times a day (QID) | ORAL | 0 refills | Status: DC | PRN

## 2022-07-02 MED ORDER — CETIRIZINE HCL 10 MG PO TABS: 10.0000 mg | ORAL_TABLET | Freq: Every day | ORAL | 0 refills | Status: DC

## 2022-07-02 NOTE — ED Provider Notes (Signed)
[facility]    CSN: [id number] Arrival date & time: 07/02/22  3335      History   Chief Complaint No chief complaint on file.   HPI James Carson is a 21 y.o. male.   The history is provided by the patient.   Patient presents for complaints of headache, body aches, and postnasal drainage that started 1 day ago.  He denies fever, chills, sore throat, ear pain, wheezing, shortness of breath, or difficulty breathing.  Patient states that he also has a mild cough.  Patient states that he took Advil for his symptoms.  He also states that there are sick contacts at his job.  Patient endorses history of seasonal allergies.  Past Medical History:  Diagnosis Date   ADHD (attention deficit hyperactivity disorder)    Asthma    Atypical nevi    right shoulder   Learning disability    dyslexia   Tremors of nervous system    benign tremors-Dr. Gaynell Carson    Patient Active Problem List   Diagnosis Date Noted   Pre-syncope 12/24/2021   Acute sinusitis 12/21/2021   Learning disability 07/21/2014   ADD (attention deficit disorder) 12/15/2013    Past Surgical History:  Procedure Laterality Date   CIRCUMCISION     IRRIGATION AND DEBRIDEMENT SEBACEOUS CYST     ritlumph     tubes in the ears         Home Medications    Prior to Admission medications   Medication Sig Start Date End Date Taking? Authorizing Provider  brompheniramine-pseudoephedrine-DM 30-2-10 MG/5ML syrup Take 5 mLs by mouth 4 (four) times daily as needed. 07/02/22  Yes James Carson, James Lea, NP  cetirizine (ZYRTEC) 10 MG tablet Take 1 tablet (10 mg total) by mouth daily. 07/02/22  Yes James Carson, James Lea, NP  fluticasone (FLONASE) 50 MCG/ACT nasal spray Place 2 sprays into both nostrils daily. 07/02/22  Yes James Carson, James Lea, NP  lisdexamfetamine (VYVANSE) 40 MG capsule Take 1 capsule (40 mg total) by mouth every morning. 05/03/22   James Drown, MD  methylphenidate (CONCERTA) 18 MG PO CR  tablet Take 1 tablet (18 mg total) by mouth daily. 05/03/22   James Drown, MD    Family History Family History  Problem Relation Age of Onset   Healthy Mother    Healthy Father     Social History Social History   Tobacco Use   Smoking status: Never   Smokeless tobacco: Never  Substance Use Topics   Alcohol use: No    Alcohol/week: 0.0 standard drinks of alcohol   Drug use: No     Allergies   Adderall xr [amphetamine-dextroamphet er] and Focalin [dexmethylphenidate]   Review of Systems Review of Systems Per HPI  Physical Exam Triage Vital Signs ED Triage Vitals  Enc Vitals Group     BP 07/02/22 1904 117/72     Pulse Rate 07/02/22 1904 69     Resp 07/02/22 1904 18     Temp 07/02/22 1904 (!) 97.2 F (36.2 C)     Temp Source 07/02/22 1904 Oral     SpO2 07/02/22 1904 98 %     Weight --      Height --      Head Circumference --      Peak Flow --      Pain Score 07/02/22 1905 8     Pain Loc --      Pain Edu? --      Excl.  in [place]? --    No data found.  Updated Vital Signs BP 117/72 (BP Location: Right Arm)   Pulse 69   Temp (!) 97.2 F (36.2 C) (Oral)   Resp 18   SpO2 98%   Visual Acuity Right Eye Distance:   Left Eye Distance:   Bilateral Distance:    Right Eye Near:   Left Eye Near:    Bilateral Near:     Physical Exam Vitals and nursing note reviewed.  Constitutional:      General: He is not in acute distress.    Appearance: Normal appearance.  HENT:     Head: Normocephalic.     Right Ear: Tympanic membrane, ear canal and external ear normal.     Left Ear: Tympanic membrane, ear canal and external ear normal.     Nose:     Right Turbinates: Enlarged and swollen.     Left Turbinates: Enlarged and swollen.     Right Sinus: Maxillary sinus tenderness and frontal sinus tenderness present.     Left Sinus: Maxillary sinus tenderness and frontal sinus tenderness present.     Mouth/Throat:     Mouth: Mucous membranes are moist.     Pharynx: No  oropharyngeal exudate or posterior oropharyngeal erythema.  Eyes:     Extraocular Movements: Extraocular movements intact.     Conjunctiva/sclera: Conjunctivae normal.     Pupils: Pupils are equal, round, and reactive to light.  Cardiovascular:     Rate and Rhythm: Normal rate and regular rhythm.     Pulses: Normal pulses.     Heart sounds: Normal heart sounds.  Pulmonary:     Effort: Pulmonary effort is normal.     Breath sounds: Normal breath sounds.  Abdominal:     General: Bowel sounds are normal.     Palpations: Abdomen is soft.  Musculoskeletal:     Cervical back: Normal range of motion.  Lymphadenopathy:     Cervical: No cervical adenopathy.  Skin:    General: Skin is warm and dry.  Neurological:     General: No focal deficit present.     Mental Status: He is alert and oriented to person, place, and time.  Psychiatric:        Mood and Affect: Mood normal.        Behavior: Behavior normal.      UC Treatments / Results  Labs (all labs ordered are listed, but only abnormal results are displayed) Labs Reviewed  COVID-19, FLU A+B NAA    EKG   Radiology No results found.  Procedures Procedures (including critical care time)  Medications Ordered in UC Medications - No data to display  Initial Impression / Assessment and Plan / UC Course  I have reviewed the triage vital signs and the nursing notes.  Pertinent labs & imaging results that were available during my care of the patient were reviewed by me and considered in my medical decision making (see chart for details).  Patient presents for complaints of upper respiratory symptoms that started 1 day ago.  On exam, patient has frontal and maxillary sinus tenderness.  Vital signs are stable, exam is otherwise benign.  COVID/flu test is pending at this time.  We will treat patient symptomatically at this time with Bromfed, fluticasone and cetirizine.  This will cover him for possible viral sinusitis versus allergic  rhinitis.  Supportive care recommendations were provided to the patient.  Patient advised to follow-up as needed.  Final Clinical Impressions(s) / UC  Diagnoses   Final diagnoses:  Symptoms of upper respiratory infection (URI)  Allergic rhinitis, unspecified seasonality, unspecified trigger     Discharge Instructions      COVID/flu test is pending.  You will be contacted if the results are positive. Take medication as directed. Increase fluids and get plenty of rest. May take over-the-counter ibuprofen or Tylenol as needed for pain, fever, or general discomfort. Recommend normal saline nasal spray to help with nasal congestion throughout the day. For your cough, it may be helpful to use a humidifier at bedtime during sleep. If your symptoms fail to improve within the next 7 to 10 days, please follow-up in our clinic.      ED Prescriptions     Medication Sig Dispense Auth. Provider   brompheniramine-pseudoephedrine-DM 30-2-10 MG/5ML syrup Take 5 mLs by mouth 4 (four) times daily as needed. 140 mL Kenza Munar-Warren, James Lea, NP   cetirizine (ZYRTEC) 10 MG tablet Take 1 tablet (10 mg total) by mouth daily. 30 tablet [employer]i [employer]-Warren, James Lea, NP   fluticasone (FLONASE) 50 MCG/ACT nasal spray Place 2 sprays into both nostrils daily. 16 g Traveion Ruddock-Warren, James Lea, NP      PDMP not reviewed this encounter.   Tish Men, NP 07/02/22 1924

## 2022-07-02 NOTE — Discharge Instructions (Addendum)
COVID/flu test is pending.  You will be contacted if the results are positive. Take medication as directed. Increase fluids and get plenty of rest. May take over-the-counter ibuprofen or Tylenol as needed for pain, fever, or general discomfort. Recommend normal saline nasal spray to help with nasal congestion throughout the day. For your cough, it may be helpful to use a humidifier at bedtime during sleep. If your symptoms fail to improve within the next 7 to 10 days, please follow-up in our clinic.

## 2022-07-02 NOTE — ED Triage Notes (Signed)
Headache, body aches and sinus drainage since yesterday.

## 2022-07-03 LAB — COVID-19, FLU A+B NAA
Influenza A, NAA: NOT DETECTED
Influenza B, NAA: NOT DETECTED
SARS-CoV-2, NAA: NOT DETECTED

## 2022-07-31 ENCOUNTER — Encounter: Payer: Self-pay | Admitting: Family Medicine

## 2022-08-02 ENCOUNTER — Other Ambulatory Visit: Payer: Self-pay | Admitting: Nurse Practitioner

## 2022-08-02 MED ORDER — METHYLPHENIDATE HCL ER (OSM) 18 MG PO TBCR: 18.0000 mg | EXTENDED_RELEASE_TABLET | Freq: Every day | ORAL | 0 refills | Status: DC

## 2022-08-09 ENCOUNTER — Other Ambulatory Visit: Payer: Self-pay | Admitting: Nurse Practitioner

## 2022-08-09 MED ORDER — LISDEXAMFETAMINE DIMESYLATE 40 MG PO CAPS
40.0000 mg | ORAL_CAPSULE | ORAL | 0 refills | Status: DC
Start: 2022-08-09 — End: 2022-09-05

## 2022-08-19 ENCOUNTER — Telehealth: Payer: Self-pay | Admitting: *Deleted

## 2022-08-19 NOTE — Telephone Encounter (Signed)
PA for Lisdexamfetamine Dimesylate '40mg'$  denied by insurance  Medication is non formulary and will only be considered:  1-the member is being treated for a refractory psychiatric disorder that was not responsive to first, second or third line medications  2- the member is under the care of psychiatrist who advises patient can not take formulary alternatives  3-must try and fail formulary alternatives: generic AdderallXR, generic Concerta, generic Focalin XR

## 2022-08-23 ENCOUNTER — Encounter: Payer: Self-pay | Admitting: Nurse Practitioner

## 2022-08-23 ENCOUNTER — Other Ambulatory Visit: Payer: Self-pay | Admitting: Nurse Practitioner

## 2022-08-23 NOTE — Telephone Encounter (Signed)
Mychart message sent to patient.

## 2022-09-05 ENCOUNTER — Other Ambulatory Visit: Payer: Self-pay | Admitting: Family Medicine

## 2022-09-05 ENCOUNTER — Encounter: Payer: Self-pay | Admitting: Family Medicine

## 2022-09-05 MED ORDER — LISDEXAMFETAMINE DIMESYLATE 40 MG PO CAPS: 40.0000 mg | ORAL_CAPSULE | ORAL | 0 refills | Status: DC

## 2022-09-05 NOTE — Telephone Encounter (Signed)
Nurses Medication was sent in to [facility] as requested Patient does need to do a follow-up office visit before getting further prescriptions-it would be wise for him to be proactive to go ahead and set this up

## 2022-09-06 ENCOUNTER — Other Ambulatory Visit: Payer: Self-pay | Admitting: Nurse Practitioner

## 2022-09-11 ENCOUNTER — Ambulatory Visit
Admission: EM | Admit: 2022-09-11 | Discharge: 2022-09-11 | Disposition: A | Discharge status: Home / Self Care | Payer: BC Managed Care – PPO

## 2022-09-11 DIAGNOSIS — J329 Chronic sinusitis, unspecified: Secondary | ICD-10-CM

## 2022-09-11 DIAGNOSIS — B9789 Other viral agents as the cause of diseases classified elsewhere: Secondary | ICD-10-CM

## 2022-09-11 MED ORDER — PSEUDOEPHEDRINE HCL ER 120 MG PO TB12: 120.0000 mg | ORAL_TABLET | Freq: Two times a day (BID) | ORAL | 0 refills | Status: DC | PRN

## 2022-09-11 MED ORDER — FLUTICASONE PROPIONATE 50 MCG/ACT NA SUSP: 1.0000 | Freq: Two times a day (BID) | NASAL | 2 refills | Status: DC

## 2022-09-11 NOTE — ED Provider Notes (Signed)
[facility]    CSN: [id number] Arrival date & time: 09/11/22  1339      History   Chief Complaint Chief Complaint  Patient presents with   Headache    HPI James Carson is a 21 y.o. male.   Patient presenting today with 1 day history of sinus headache, congestion, dry cough, scratchy throat.  Denies fever, chills, body aches, chest pain, shortness of breath, abdominal pain, nausea vomiting or diarrhea.  So far trying Tylenol with minimal relief.  Unsure if he has a history of seasonal allergies, not currently taking anything for this.  No known sick contacts that he is aware of.    Past Medical History:  Diagnosis Date   ADHD (attention deficit hyperactivity disorder)    Asthma    Atypical nevi    right shoulder   Learning disability    dyslexia   Tremors of nervous system    benign tremors-Dr. Gaynell Face    Patient Active Problem List   Diagnosis Date Noted   Pre-syncope 12/24/2021   Acute sinusitis 12/21/2021   Learning disability 07/21/2014   ADD (attention deficit disorder) 12/15/2013    Past Surgical History:  Procedure Laterality Date   CIRCUMCISION     IRRIGATION AND DEBRIDEMENT SEBACEOUS CYST     ritlumph     tubes in the ears         Home Medications    Prior to Admission medications   Medication Sig Start Date End Date Taking? Authorizing Provider  ibuprofen (ADVIL) 200 MG tablet Take 200 mg by mouth every 6 (six) hours as needed.   Yes [provider]  pseudoephedrine (SUDAFED 12 HOUR) 120 MG 12 hr tablet Take 1 tablet (120 mg total) by mouth every 12 (twelve) hours as needed for congestion. 09/11/22  Yes Volney American, PA-C  brompheniramine-pseudoephedrine-DM 30-2-10 MG/5ML syrup Take 5 mLs by mouth 4 (four) times daily as needed. 07/02/22   Leath-Warren, Alda Lea, NP  cetirizine (ZYRTEC) 10 MG tablet Take 1 tablet (10 mg total) by mouth daily. 07/02/22   Leath-Warren, Alda Lea, NP  fluticasone (FLONASE) 50  MCG/ACT nasal spray Place 1 spray into both nostrils 2 (two) times daily. 09/11/22   Volney American, PA-C  lisdexamfetamine (VYVANSE) 40 MG capsule Take 1 capsule (40 mg total) by mouth every morning. 09/05/22   Kathyrn Drown, MD    Family History Family History  Problem Relation Age of Onset   Healthy Mother    Healthy Father     Social History Social History   Tobacco Use   Smoking status: Never   Smokeless tobacco: Never  Substance Use Topics   Alcohol use: No    Alcohol/week: 0.0 standard drinks of alcohol   Drug use: Never     Allergies   Adderall xr [amphetamine-dextroamphet er], Concerta [methylphenidate], and Focalin [dexmethylphenidate]   Review of Systems Review of Systems Per HPI  Physical Exam Triage Vital Signs ED Triage Vitals  Enc Vitals Group     BP 09/11/22 1348 124/75     Pulse Rate 09/11/22 1348 71     Resp 09/11/22 1348 16     Temp 09/11/22 1348 97.9 F (36.6 C)     Temp Source 09/11/22 1348 Oral     SpO2 09/11/22 1348 98 %     Weight --      Height --      Head Circumference --      Peak Flow --  Pain Score 09/11/22 1346 10     Pain Loc --      Pain Edu? --      Excl. in [place]? --    No data found.  Updated Vital Signs BP 124/75 (BP Location: Right Arm)   Pulse 71   Temp 97.9 F (36.6 C) (Oral)   Resp 16   SpO2 98%   Visual Acuity Right Eye Distance:   Left Eye Distance:   Bilateral Distance:    Right Eye Near:   Left Eye Near:    Bilateral Near:     Physical Exam Vitals and nursing note reviewed.  Constitutional:      Appearance: He is well-developed.  HENT:     Head: Atraumatic.     Right Ear: External ear normal.     Left Ear: External ear normal.     Nose: Rhinorrhea present.     Mouth/Throat:     Mouth: Mucous membranes are moist.     Pharynx: Posterior oropharyngeal erythema present. No oropharyngeal exudate.  Eyes:     Conjunctiva/sclera: Conjunctivae normal.     Pupils: Pupils are equal, round,  and reactive to light.  Cardiovascular:     Rate and Rhythm: Normal rate and regular rhythm.  Pulmonary:     Effort: Pulmonary effort is normal. No respiratory distress.     Breath sounds: No wheezing or rales.  Musculoskeletal:        General: Normal range of motion.     Cervical back: Normal range of motion and neck supple.  Lymphadenopathy:     Cervical: No cervical adenopathy.  Skin:    General: Skin is warm and dry.  Neurological:     Mental Status: He is alert and oriented to person, place, and time.     Motor: No weakness.     Gait: Gait normal.  Psychiatric:        Behavior: Behavior normal.    UC Treatments / Results  Labs (all labs ordered are listed, but only abnormal results are displayed) Labs Reviewed - No data to display  EKG   Radiology No results found.  Procedures Procedures (including critical care time)  Medications Ordered in UC Medications - No data to display  Initial Impression / Assessment and Plan / UC Course  I have reviewed the triage vital signs and the nursing notes.  Pertinent labs & imaging results that were available during my care of the patient were reviewed by me and considered in my medical decision making (see chart for details).     Suspect new onset viral infection.  Treat with Flonase, Sudafed, over-the-counter supportive medications and home care.  Work note given.  Declines viral testing today.  Return for worsening symptoms.  Final Clinical Impressions(s) / UC Diagnoses   Final diagnoses:  Viral sinusitis   Discharge Instructions   None    ED Prescriptions     Medication Sig Dispense Auth. Provider   fluticasone (FLONASE) 50 MCG/ACT nasal spray Place 1 spray into both nostrils 2 (two) times daily. 16 g Volney American, [place]   pseudoephedrine (SUDAFED 12 HOUR) 120 MG 12 hr tablet Take 1 tablet (120 mg total) by mouth every 12 (twelve) hours as needed for congestion. 100 tablet Volney American, [place]       PDMP not reviewed this encounter.   Volney American, [place] 09/11/22 1459

## 2022-09-11 NOTE — ED Triage Notes (Signed)
Pt reports headache and sinus pressure x 1 day. Advil gives no relief.

## 2022-09-16 ENCOUNTER — Encounter: Payer: Self-pay | Admitting: Family Medicine

## 2022-09-16 NOTE — Telephone Encounter (Signed)
Front  I dictated a letter for the patient Please print the letter Have any sign Then fax it to the number that Amy provided Please complete this ASAP Thank you so much-Dr. Nicki Reaper  Please also send notification to [place] that this has been completed thank you

## 2022-09-17 ENCOUNTER — Encounter: Payer: Self-pay | Admitting: Family Medicine

## 2022-09-17 NOTE — Telephone Encounter (Signed)
Front The letter was written we corrected to reflect his current medicine Vyvanse Please print off the letter I will sign it and then and you can pick it thank you

## 2022-10-11 ENCOUNTER — Ambulatory Visit: Payer: BC Managed Care – PPO | Admitting: Family Medicine

## 2022-10-11 ENCOUNTER — Encounter: Payer: Self-pay | Admitting: Family Medicine

## 2022-10-11 VITALS — BP 118/74 | Wt 171.8 lb

## 2022-10-11 DIAGNOSIS — F909 Attention-deficit hyperactivity disorder, unspecified type: Secondary | ICD-10-CM

## 2022-10-11 MED ORDER — LISDEXAMFETAMINE DIMESYLATE 40 MG PO CAPS
40.0000 mg | ORAL_CAPSULE | ORAL | 0 refills | Status: DC
Start: 2022-10-11 — End: 2023-02-07

## 2022-10-11 MED ORDER — LISDEXAMFETAMINE DIMESYLATE 40 MG PO CAPS
ORAL_CAPSULE | ORAL | 0 refills | Status: DC
Start: 2022-10-11 — End: 2022-12-12

## 2022-10-11 MED ORDER — LISDEXAMFETAMINE DIMESYLATE 40 MG PO CAPS: 40.0000 mg | ORAL_CAPSULE | ORAL | 0 refills | Status: DC

## 2022-10-11 NOTE — Progress Notes (Signed)
   Subjective:    Patient ID: James Carson, male    DOB: Nov 08, 2001, 21 y.o.   MRN: [id number]  HPI This patient has adult ADD. Takes medication responsibly. Medication does help the patient focus in be more functional. Patient relates that they are or not abusing the medication or misusing the medication. The patient understands that if they're having any negative side effects such as elevated high blood pressure severe headaches they would need stop the medication follow-up immediately. They also understand that the prescriptions are to last for 3 months then the patient will need to follow-up before having further prescriptions.  Patient compliance: Vyvanse 40 mg   Does medication help patient function /attention better: yes  Side effects: none  He states medicine does help him stay more focused Denies any problems with his appetite Energy level doing okay   Review of Systems     Objective:   Physical Exam General-in no acute distress Eyes-no discharge Lungs-respiratory rate normal, CTA CV-no murmurs,RRR Extremities skin warm dry no edema Neuro grossly normal Behavior normal, alert   His prescriptions were sent in Drug registry was checked     Assessment & Plan:  Patient overall is doing very well he is not having any problems with his medicine he takes it every day around about 11 AM he states he is able to go to sleep okay able to eat okay he denies any misuse issues.  States overall energy level and focus is doing well  I would recommend that the patient do a follow-up visit with [place] in 4 months.  In approximately 3 months he can let us know he needs his next prescription sent in

## 2022-11-07 ENCOUNTER — Encounter: Payer: Self-pay | Admitting: Family Medicine

## 2022-11-07 ENCOUNTER — Other Ambulatory Visit: Payer: Self-pay | Admitting: Family Medicine

## 2022-11-07 MED ORDER — LISDEXAMFETAMINE DIMESYLATE 40 MG PO CHEW: CHEWABLE_TABLET | ORAL | 0 refills | Status: DC

## 2022-12-12 ENCOUNTER — Other Ambulatory Visit: Payer: Self-pay | Admitting: Family Medicine

## 2022-12-12 ENCOUNTER — Encounter: Payer: Self-pay | Admitting: Family Medicine

## 2022-12-12 MED ORDER — LISDEXAMFETAMINE DIMESYLATE 40 MG PO CAPS: 40.0000 mg | ORAL_CAPSULE | ORAL | 0 refills | Status: DC

## 2022-12-12 MED ORDER — LISDEXAMFETAMINE DIMESYLATE 40 MG PO CAPS: ORAL_CAPSULE | ORAL | 0 refills | Status: DC

## 2023-01-13 ENCOUNTER — Ambulatory Visit
Admission: RE | Admit: 2023-01-13 | Discharge: 2023-01-13 | Disposition: A | Discharge status: Home / Self Care | Payer: BC Managed Care – PPO | Source: Ambulatory Visit | Attending: Nurse Practitioner | Admitting: Nurse Practitioner

## 2023-01-13 VITALS — BP 125/68 | HR 69 | Temp 98.0°F | Resp 18

## 2023-01-13 DIAGNOSIS — U071 COVID-19: Secondary | ICD-10-CM | POA: Diagnosis not present

## 2023-01-13 DIAGNOSIS — J069 Acute upper respiratory infection, unspecified: Secondary | ICD-10-CM

## 2023-01-13 MED ORDER — BENZONATATE 100 MG PO CAPS: 100.0000 mg | ORAL_CAPSULE | Freq: Three times a day (TID) | ORAL | 0 refills | Status: DC | PRN

## 2023-01-13 NOTE — ED Provider Notes (Signed)
[facility]    CSN: [id number] Arrival date & time: 01/13/23  1138      History   Chief Complaint Chief Complaint  Patient presents with   Cough    Sinus congestion and cough with chest congestion. No fever At home Covid Test taken 9:30 pm 01/12/2023- POSITIVENeed to confirm if Covid and obtain Paxlovid if possible. - Entered by patient   Appointment    1200    HPI James Carson is a 22 y.o. male.   Patient presents today for 1 day history of fatigue, cold chills, congested cough, chest and nasal congestion, runny nose and postnasal drainage, sore throat, headache.  Reports he had a sinus infection 1 week ago and felt like he had fully recovered until yesterday.  He denies shortness of breath or chest pain, ear pain, abdominal pain, nausea/vomiting, diarrhea, decreased appetite, and loss of taste or smell.  Reports he tested positive for COVID-19 last night.  He is specifically requesting Paxlovid for symptoms.  Has been taking over-the-counter cough medications, Tylenol, Sudafed, and Mucinex for symptoms with mild benefit.    Past Medical History:  Diagnosis Date   ADHD (attention deficit hyperactivity disorder)    Asthma    Atypical nevi    right shoulder   Learning disability    dyslexia   Tremors of nervous system    benign tremors-Dr. Gaynell Face    Patient Active Problem List   Diagnosis Date Noted   Pre-syncope 12/24/2021   Acute sinusitis 12/21/2021   Learning disability 07/21/2014   ADD (attention deficit disorder) 12/15/2013    Past Surgical History:  Procedure Laterality Date   CIRCUMCISION     IRRIGATION AND DEBRIDEMENT SEBACEOUS CYST     ritlumph     tubes in the ears         Home Medications    Prior to Admission medications   Medication Sig Start Date End Date Taking? Authorizing Provider  benzonatate (TESSALON) 100 MG capsule Take 1 capsule (100 mg total) by mouth 3 (three) times daily as needed for cough. Do not take with  alcohol or while driving or operating heavy machinery.  May cause drowsiness. 01/13/23  Yes Eulogio Bear, NP  Lisdexamfetamine Dimesylate (VYVANSE) 40 MG CHEW 1 daily 11/07/22   Kathyrn Drown, MD  cetirizine (ZYRTEC) 10 MG tablet Take 1 tablet (10 mg total) by mouth daily. 07/02/22   Leath-Warren, Alda Lea, NP  fluticasone (FLONASE) 50 MCG/ACT nasal spray Place 1 spray into both nostrils 2 (two) times daily. 09/11/22   Volney American, PA-C  ibuprofen (ADVIL) 200 MG tablet Take 200 mg by mouth every 6 (six) hours as needed.    [provider]  lisdexamfetamine (VYVANSE) 40 MG capsule Take 1 capsule (40 mg total) by mouth every morning. 10/11/22   Kathyrn Drown, MD  lisdexamfetamine (VYVANSE) 40 MG capsule 1 qd 12/12/22   Kathyrn Drown, MD  lisdexamfetamine (VYVANSE) 40 MG capsule Take 1 capsule (40 mg total) by mouth every morning. 12/12/22   Kathyrn Drown, MD    Family History Family History  Problem Relation Age of Onset   Healthy Mother    Healthy Father     Social History Social History   Tobacco Use   Smoking status: Never   Smokeless tobacco: Never  Substance Use Topics   Alcohol use: No    Alcohol/week: 0.0 standard drinks of alcohol   Drug use: Never     Allergies  Adderall xr [amphetamine-dextroamphet er], Concerta [methylphenidate], and Focalin [dexmethylphenidate]   Review of Systems Review of Systems Per HPI  Physical Exam Triage Vital Signs ED Triage Vitals  Enc Vitals Group     BP 01/13/23 1224 125/68     Pulse Rate 01/13/23 1224 69     Resp 01/13/23 1224 18     Temp 01/13/23 1224 98 F (36.7 C)     Temp Source 01/13/23 1224 Oral     SpO2 01/13/23 1224 97 %     Weight --      Height --      Head Circumference --      Peak Flow --      Pain Score 01/13/23 1223 0     Pain Loc --      Pain Edu? --      Excl. in [place]? --    No data found.  Updated Vital Signs BP 125/68 (BP Location: Right Arm)   Pulse 69   Temp 98 F  (36.7 C) (Oral)   Resp 18   SpO2 97%   Visual Acuity Right Eye Distance:   Left Eye Distance:   Bilateral Distance:    Right Eye Near:   Left Eye Near:    Bilateral Near:     Physical Exam Vitals and nursing note reviewed.  Constitutional:      General: He is not in acute distress.    Appearance: Normal appearance. He is not ill-appearing or toxic-appearing.  HENT:     Head: Normocephalic and atraumatic.     Right Ear: Tympanic membrane, ear canal and external ear normal.     Left Ear: Tympanic membrane, ear canal and external ear normal.     Nose: Congestion present. No rhinorrhea.     Mouth/Throat:     Mouth: Mucous membranes are moist.     Pharynx: Oropharynx is clear. No oropharyngeal exudate or posterior oropharyngeal erythema.  Eyes:     General: No scleral icterus.    Extraocular Movements: Extraocular movements intact.  Cardiovascular:     Rate and Rhythm: Normal rate and regular rhythm.  Pulmonary:     Effort: Pulmonary effort is normal. No respiratory distress.     Breath sounds: Normal breath sounds. No wheezing, rhonchi or rales.  Abdominal:     General: Abdomen is flat. Bowel sounds are normal. There is no distension.     Palpations: Abdomen is soft.  Musculoskeletal:     Cervical back: Normal range of motion and neck supple.  Lymphadenopathy:     Cervical: No cervical adenopathy.  Skin:    General: Skin is warm and dry.     Coloration: Skin is not jaundiced or pale.     Findings: No erythema or rash.  Neurological:     Mental Status: He is alert and oriented to person, place, and time.  Psychiatric:        Behavior: Behavior is cooperative.      UC Treatments / Results  Labs (all labs ordered are listed, but only abnormal results are displayed) Labs Reviewed  SARS CORONAVIRUS 2 (TAT 6-24 HRS)  BASIC METABOLIC PANEL    EKG   Radiology No results found.  Procedures Procedures (including critical care time)  Medications Ordered in  UC Medications - No data to display  Initial Impression / Assessment and Plan / UC Course  I have reviewed the triage vital signs and the nursing notes.  Pertinent labs & imaging results that were available during  my care of the patient were reviewed by me and considered in my medical decision making (see chart for details).   Patient is well-appearing, normotensive, afebrile, not tachycardic, not tachypneic, oxygenating well on room air.   1. Positive self-administered antigen test for COVID-19 2. Viral URI with cough Discussed with patient that symptoms are consistent with COVID-19 Also discussed that antiviral medications are typically reserved for those with risk for hospitalization/to severe disease Supportive care discussed Start cough suppressant for dry cough Will check GFR today, as long as normal, can start Paxlovid tomorrow as patient requests this Isolation criteria discussed and note given for work ER and return precautions discussed   The patient was given the opportunity to ask questions.  All questions answered to their satisfaction.  The patient is in agreement to this plan.   Final Clinical Impressions(s) / UC Diagnoses   Final diagnoses:  Positive self-administered antigen test for COVID-19  Viral URI with cough     Discharge Instructions      You have a viral upper respiratory infection.  Your symptoms are consistent with COVID-19.  Symptoms should improve over the next week to 10 days.  If you develop chest pain or shortness of breath, go to the emergency room.  We have tested you today for COVID-19 and have also tested your blood work for kidney function.  You will see the results in Mychart and we will call you with positive results.    Please stay home and isolate until you are aware of the results.    Some things that can make you feel better are: - Increased rest - Increasing fluid with water/sugar free electrolytes - Acetaminophen and ibuprofen as  needed for fever/pain - Salt water gargling, chloraseptic spray and throat lozenges for sore throat - OTC guaifenesin (Mucinex) 600 mg twice daily for congestion - Saline sinus flushes or a neti pot for congestion - Humidifying the air for congestion -Tessalon Perles every 8 hours during the day as needed for dry cough     ED Prescriptions     Medication Sig Dispense Auth. Provider   benzonatate (TESSALON) 100 MG capsule Take 1 capsule (100 mg total) by mouth 3 (three) times daily as needed for cough. Do not take with alcohol or while driving or operating heavy machinery.  May cause drowsiness. 21 capsule Eulogio Bear, NP      PDMP not reviewed this encounter.   Eulogio Bear, NP 01/13/23 1301

## 2023-01-13 NOTE — ED Triage Notes (Signed)
Pt reports cough and nasal congestion x 3 days; fatigue and was not able to get out of bed yesterday. Pt had a positive COVID test on 01/12/2023.   Pt wants to know if he can take Paxlovid as his mom has asthma.

## 2023-01-13 NOTE — Discharge Instructions (Addendum)
You have a viral upper respiratory infection.  Your symptoms are consistent with COVID-19.  Symptoms should improve over the next week to 10 days.  If you develop chest pain or shortness of breath, go to the emergency room.  We have tested you today for COVID-19 and have also tested your blood work for kidney function.  You will see the results in Mychart and we will call you with positive results.    Please stay home and isolate until you are aware of the results.    Some things that can make you feel better are: - Increased rest - Increasing fluid with water/sugar free electrolytes - Acetaminophen and ibuprofen as needed for fever/pain - Salt water gargling, chloraseptic spray and throat lozenges for sore throat - OTC guaifenesin (Mucinex) 600 mg twice daily for congestion - Saline sinus flushes or a neti pot for congestion - Humidifying the air for congestion -Tessalon Perles every 8 hours during the day as needed for dry cough

## 2023-01-14 ENCOUNTER — Telehealth (HOSPITAL_COMMUNITY): Payer: Self-pay | Admitting: Emergency Medicine

## 2023-01-14 LAB — BASIC METABOLIC PANEL
BUN/Creatinine Ratio: 13 (ref 9–20)
BUN: 11 mg/dL (ref 6–20)
CO2: 22 mmol/L (ref 20–29)
Calcium: 9.2 mg/dL (ref 8.7–10.2)
Chloride: 108 mmol/L — ABNORMAL HIGH (ref 96–106)
Creatinine, Ser: 0.85 mg/dL (ref 0.76–1.27)
Glucose: 94 mg/dL (ref 70–99)
Potassium: 4.3 mmol/L (ref 3.5–5.2)
Sodium: 145 mmol/L — ABNORMAL HIGH (ref 134–144)
eGFR: 126 mL/min/{1.73_m2} (ref >59)

## 2023-01-14 LAB — SARS CORONAVIRUS 2 (TAT 6-24 HRS): SARS Coronavirus 2: POSITIVE — AB

## 2023-01-14 MED ORDER — NIRMATRELVIR/RITONAVIR (PAXLOVID)TABLET: 3.0000 | ORAL_TABLET | Freq: Two times a day (BID) | ORAL | 0 refills | Status: AC

## 2023-02-07 ENCOUNTER — Ambulatory Visit: Payer: BC Managed Care – PPO | Admitting: Family Medicine

## 2023-02-07 ENCOUNTER — Encounter: Payer: Self-pay | Admitting: Family Medicine

## 2023-02-07 VITALS — BP 111/78 | HR 59 | Wt 177.6 lb

## 2023-02-07 DIAGNOSIS — F909 Attention-deficit hyperactivity disorder, unspecified type: Secondary | ICD-10-CM | POA: Diagnosis not present

## 2023-02-07 MED ORDER — LISDEXAMFETAMINE DIMESYLATE 40 MG PO CHEW: CHEWABLE_TABLET | ORAL | 0 refills | Status: DC

## 2023-02-07 MED ORDER — LISDEXAMFETAMINE DIMESYLATE 40 MG PO CAPS: ORAL_CAPSULE | ORAL | 0 refills | Status: DC

## 2023-02-07 MED ORDER — LISDEXAMFETAMINE DIMESYLATE 40 MG PO CAPS: 40.0000 mg | ORAL_CAPSULE | ORAL | 0 refills | Status: DC

## 2023-02-07 NOTE — Progress Notes (Signed)
   Subjective:    Patient ID: James Carson, male    DOB: 10/05/2001, 22 y.o.   MRN: [id number]  HPI This patient has adult ADD. Takes medication responsibly. Medication does help the patient focus in be more functional. Patient relates that they are or not abusing the medication or misusing the medication. The patient understands that if they're having any negative side effects such as elevated high blood pressure severe headaches they would need stop the medication follow-up immediately. They also understand that the prescriptions are to last for 3 months then the patient will need to follow-up before having further prescriptions.  Patient compliance good compliance He does a good job taking his medicine Takes it every day States it does help him focus and stay attentive Denies problems with Does medication help patient function /attention better it does help him a lot  Side effects denies any side effects   Review of Systems     Objective:   Physical Exam  General-in no acute distress Eyes-no discharge Lungs-respiratory rate normal, CTA CV-no murmurs,RRR Extremities skin warm dry no edema Neuro grossly normal previous labs from his ER visit reviewed with the patient Behavior normal, alert       Assessment & Plan:   Adult ADD Does well with medicine Continue current medication In addition to this recommend follow-up in approximately 4 months notify us when he needs his new prescription Wellness on the next visit

## 2023-06-12 ENCOUNTER — Ambulatory Visit: Payer: BC Managed Care – PPO | Admitting: Family Medicine

## 2023-06-12 VITALS — BP 118/73 | HR 77 | Wt 181.6 lb

## 2023-06-12 DIAGNOSIS — F909 Attention-deficit hyperactivity disorder, unspecified type: Secondary | ICD-10-CM | POA: Diagnosis not present

## 2023-06-12 MED ORDER — LISDEXAMFETAMINE DIMESYLATE 40 MG PO CAPS: 40.0000 mg | ORAL_CAPSULE | ORAL | 0 refills | Status: DC

## 2023-06-12 MED ORDER — LISDEXAMFETAMINE DIMESYLATE 40 MG PO CAPS
40.0000 mg | ORAL_CAPSULE | ORAL | 0 refills | Status: DC
Start: 2023-06-12 — End: 2023-10-14

## 2023-06-12 MED ORDER — LISDEXAMFETAMINE DIMESYLATE 40 MG PO CHEW: CHEWABLE_TABLET | ORAL | 0 refills | Status: DC

## 2023-06-12 NOTE — Progress Notes (Signed)
   Subjective:    Patient ID: James Carson, male    DOB: 21-Jun-2001, 22 y.o.   MRN: [id number]  HPI This patient has adult ADD. Takes medication responsibly. Medication does help the patient focus in be more functional. Patient relates that they are or not abusing the medication or misusing the medication. The patient understands that if they're having any negative side effects such as elevated high blood pressure severe headaches they would need stop the medication follow-up immediately. They also understand that the prescriptions are to last for 3 months then the patient will need to follow-up before having further prescriptions.  Patient compliance yes  Does medication help patient function /attention better  yes  Side effects  no  Patient takes medicine every day He states that allows him to function better He denies abusing it States more focused    Review of Systems     Objective:   Physical Exam General-in no acute distress Eyes-no discharge Lungs-respiratory rate normal, CTA CV-no murmurs,RRR Extremities skin warm dry no edema Neuro grossly normal Behavior normal, alert        Assessment & Plan:  ADD The patient was seen today as part of the visit regarding ADD.  Patient is stable on current regimen.  Appropriate prescriptions prescribed.  Medications were reviewed with the patient as well as compliance. Side effects were checked for. Discussion regarding effectiveness was held. Prescriptions were electronically sent in.  Patient reminded to follow-up in approximately 3 months.   Plans to [facility] law with drug registry was checked and verified while present with the patient. He will send [place] a message after the third prescription so we will send in for prescription He will follow-up in 4 months

## 2023-07-22 ENCOUNTER — Ambulatory Visit
Admission: RE | Admit: 2023-07-22 | Discharge: 2023-07-22 | Disposition: A | Discharge status: Home / Self Care | Payer: BC Managed Care – PPO | Source: Ambulatory Visit | Attending: Family Medicine | Admitting: Family Medicine

## 2023-07-22 VITALS — BP 107/62 | HR 61 | Temp 97.9°F | Resp 13

## 2023-07-22 DIAGNOSIS — T148XXA Other injury of unspecified body region, initial encounter: Secondary | ICD-10-CM

## 2023-07-22 MED ORDER — CHLORHEXIDINE GLUCONATE 4 % EX SOLN: Freq: Every day | CUTANEOUS | 0 refills | Status: DC | PRN

## 2023-07-22 MED ORDER — CEPHALEXIN 500 MG PO CAPS: 500.0000 mg | ORAL_CAPSULE | Freq: Two times a day (BID) | ORAL | 0 refills | Status: DC

## 2023-07-22 MED ORDER — SILVER SULFADIAZINE 1 % EX CREA: 1.0000 | TOPICAL_CREAM | Freq: Two times a day (BID) | CUTANEOUS | 0 refills | Status: DC

## 2023-07-22 NOTE — Discharge Instructions (Signed)
Clean the area about twice daily with Hibiclens solution and apply the Silvadene cream.  Use nonstick gauze pads and Coban wrap to dress the area at all times.  I have also sent over an antibiotic that you may either wait to see if things improve on their own or go ahead and take.

## 2023-07-22 NOTE — ED Triage Notes (Addendum)
Pt c/o blister on the top of the left foot. Open and draining clear fluid. Pt states he thinks it may be sun poisoning. Pt noticed the spot on Sunday when it statrted itching so he scratched it and the sun came off

## 2023-07-22 NOTE — ED Notes (Signed)
Site cleaned with Hibiclens and water. Bacitracin,nonadherent pad, secured with coban. Pt tolerated well.   Site management and infection prevention education reviewed. Pt verbalized understanding.

## 2023-07-22 NOTE — ED Provider Notes (Signed)
[facility]    CSN: [id number] Arrival date & time: 07/22/23  1144      History   Chief Complaint Chief Complaint  Patient presents with   Blister    Blister (?)  or insect bite on top of left foot.Possible sunburn blister- open sore draining clear.  Don't want to get infected - Entered by patient    HPI James Carson is a 22 y.o. male.   Patient presenting today with a blister on the top of left foot that popped yesterday and is now red, swollen and painful.  Thinks he may have gotten a sunburn on the foot, no known injury or insect bites that he is aware of.  Denies fever, chills, decreased range of motion, numbness, tingling.  So far trying Neosporin and bandage with no relief.   Past Medical History:  Diagnosis Date   ADHD (attention deficit hyperactivity disorder)    Asthma    Atypical nevi    right shoulder   Learning disability    dyslexia   Tremors of nervous system    benign tremors-Dr. Sharene Skeans    Patient Active Problem List   Diagnosis Date Noted   Pre-syncope 12/24/2021   Acute sinusitis 12/21/2021   Learning disability 07/21/2014   ADD (attention deficit disorder) 12/15/2013    Past Surgical History:  Procedure Laterality Date   CIRCUMCISION     IRRIGATION AND DEBRIDEMENT SEBACEOUS CYST     ritlumph     tubes in the ears         Home Medications    Prior to Admission medications   Medication Sig Start Date End Date Taking? Authorizing Provider  cephALEXin (KEFLEX) 500 MG capsule Take 1 capsule (500 mg total) by mouth 2 (two) times daily. 07/22/23  Yes Particia Nearing, PA-C  chlorhexidine (HIBICLENS) 4 % external liquid Apply topically daily as needed. 07/22/23  Yes Particia Nearing, PA-C  silver sulfADIAZINE (SILVADENE) 1 % cream Apply 1 Application topically 2 (two) times daily. 07/22/23  Yes Particia Nearing, PA-C  ibuprofen (ADVIL) 200 MG tablet Take 200 mg by mouth every 6 (six) hours as needed.     [provider]  lisdexamfetamine (VYVANSE) 40 MG capsule Take 1 capsule (40 mg total) by mouth every morning. 06/12/23   Babs Sciara, MD  lisdexamfetamine (VYVANSE) 40 MG capsule Take 1 capsule (40 mg total) by mouth every morning. 06/12/23   Babs Sciara, MD  Lisdexamfetamine Dimesylate (VYVANSE) 40 MG CHEW 1 daily 06/12/23   Babs Sciara, MD    Family History Family History  Problem Relation Age of Onset   Healthy Mother    Healthy Father     Social History Social History   Tobacco Use   Smoking status: Never   Smokeless tobacco: Never  Substance Use Topics   Alcohol use: No    Alcohol/week: 0.0 standard drinks of alcohol   Drug use: Never     Allergies   Adderall xr [amphetamine-dextroamphet er], Concerta [methylphenidate], and Focalin [dexmethylphenidate]   Review of Systems Review of Systems Per HPI  Physical Exam Triage Vital Signs ED Triage Vitals  Encounter Vitals Group     BP 07/22/23 1205 107/62     Systolic BP Percentile --      Diastolic BP Percentile --      Pulse Rate 07/22/23 1205 61     Resp 07/22/23 1205 13     Temp 07/22/23 1205 97.9 F (36.6 C)  Temp Source 07/22/23 1205 Oral     SpO2 07/22/23 1205 97 %     Weight --      Height --      Head Circumference --      Peak Flow --      Pain Score 07/22/23 1208 5     Pain Loc --      Pain Education --      Exclude from Growth Chart --    No data found.  Updated Vital Signs BP 107/62   Pulse 61   Temp 97.9 F (36.6 C) (Oral)   Resp 13   SpO2 97%   Visual Acuity Right Eye Distance:   Left Eye Distance:   Bilateral Distance:    Right Eye Near:   Left Eye Near:    Bilateral Near:     Physical Exam Vitals and nursing note reviewed.  Constitutional:      Appearance: Normal appearance.  HENT:     Head: Atraumatic.  Eyes:     Extraocular Movements: Extraocular movements intact.     Conjunctiva/sclera: Conjunctivae normal.  Cardiovascular:     Rate and  Rhythm: Normal rate and regular rhythm.  Pulmonary:     Effort: Pulmonary effort is normal.     Breath sounds: Normal breath sounds.  Musculoskeletal:        General: Normal range of motion.     Cervical back: Normal range of motion and neck supple.  Skin:    General: Skin is warm.     Comments: Multiple ulcerated areas to the dorsal left foot with surrounding erythema, edema, clear drainage  Neurological:     Comments: Left lower extremity neurovascularly intact  Psychiatric:        Mood and Affect: Mood normal.        Thought Content: Thought content normal.        Judgment: Judgment normal.      UC Treatments / Results  Labs (all labs ordered are listed, but only abnormal results are displayed) Labs Reviewed - No data to display  EKG   Radiology No results found.  Procedures Procedures (including critical care time)  Medications Ordered in UC Medications - No data to display  Initial Impression / Assessment and Plan / UC Course  I have reviewed the triage vital signs and the nursing notes.  Pertinent labs & imaging results that were available during my care of the patient were reviewed by me and considered in my medical decision making (see chart for details).     Fairly deep ulcerations from blisters, which patient thinks is secondary to a sunburn.  Does have some surrounding erythema, edema to these ulcerations so we will start Keflex and good wound care with Silvadene, Hibiclens.  Wound dressed prior to discharge today.  Final Clinical Impressions(s) / UC Diagnoses   Final diagnoses:  Blister     Discharge Instructions      Clean the area about twice daily with Hibiclens solution and apply the Silvadene cream.  Use nonstick gauze pads and Coban wrap to dress the area at all times.  I have also sent over an antibiotic that you may either wait to see if things improve on their own or go ahead and take.    ED Prescriptions     Medication Sig Dispense  Auth. Provider   silver sulfADIAZINE (SILVADENE) 1 % cream Apply 1 Application topically 2 (two) times daily. 100 g Particia Nearing, PA-C   chlorhexidine (HIBICLENS)  4 % external liquid Apply topically daily as needed. 236 mL Particia Nearing, PA-C   cephALEXin (KEFLEX) 500 MG capsule Take 1 capsule (500 mg total) by mouth 2 (two) times daily. 10 capsule Particia Nearing, [place]      PDMP not reviewed this encounter.   Particia Nearing, [place] 07/22/23 1220

## 2023-08-29 ENCOUNTER — Encounter: Payer: Self-pay | Admitting: Family Medicine

## 2023-08-31 ENCOUNTER — Encounter: Payer: Self-pay | Admitting: Family Medicine

## 2023-10-01 ENCOUNTER — Encounter: Payer: Self-pay | Admitting: Family Medicine

## 2023-10-01 ENCOUNTER — Other Ambulatory Visit: Payer: Self-pay | Admitting: Family Medicine

## 2023-10-01 MED ORDER — LISDEXAMFETAMINE DIMESYLATE 40 MG PO CAPS
40.0000 mg | ORAL_CAPSULE | ORAL | 0 refills | Status: DC
Start: 2023-10-01 — End: 2023-10-14

## 2023-10-14 ENCOUNTER — Ambulatory Visit: Payer: BC Managed Care – PPO | Admitting: Family Medicine

## 2023-10-14 VITALS — BP 120/74 | HR 74 | Ht 73.0 in | Wt 182.4 lb

## 2023-10-14 DIAGNOSIS — F909 Attention-deficit hyperactivity disorder, unspecified type: Secondary | ICD-10-CM

## 2023-10-14 MED ORDER — LISDEXAMFETAMINE DIMESYLATE 40 MG PO CAPS: 40.0000 mg | ORAL_CAPSULE | ORAL | 0 refills | Status: DC

## 2023-10-14 MED ORDER — LISDEXAMFETAMINE DIMESYLATE 40 MG PO CAPS
40.0000 mg | ORAL_CAPSULE | ORAL | 0 refills | Status: DC
Start: 2023-10-14 — End: 2024-02-02

## 2023-10-14 NOTE — Progress Notes (Signed)
   Subjective:    Patient ID: James Carson, male    DOB: 04/18/2001, 22 y.o.   MRN: [id number]  Discussed the use of AI scribe software for clinical note transcription with the patient, who gave verbal consent to proceed.  History of Present Illness   The patient, who works night shifts, has been managing well with his current regimen for Attention Deficit Disorder (ADD). He takes his medication around 1:30 PM, just before leaving for work, and reports that it helps him be more attentive. The medication is taken daily, not just on work days. He reports no concerns about his health and maintains a steady weight. He also engages in recreational activities such as fishing.  The patient's sleep schedule is adjusted to his work hours, typically going to bed after a shower post-work, around 2 AM. He reports sleeping well. No new medications have been introduced and the patient has not reported any new health issues.  The patient's energy levels are good and he maintains a safe lifestyle. He also has a social circle with whom he spends time. The patient's medication is obtained from [employer].  The patient is an active participant in fishing tournaments, which he enjoys. He reports no significant upcoming events apart from these tournaments.         Review of Systems     Objective:    Physical Exam   CHEST: Lungs clear to auscultation.     General-in no acute distress Eyes-no discharge Lungs-respiratory rate normal, CTA CV-no murmurs,RRR Extremities skin warm dry no edema Neuro grossly normal Behavior normal, alert       Assessment & Plan:  Assessment and Plan    Attention Deficit Disorder Stable on current medication regimen. Reports improved attentiveness at work. Medication taken daily, approximately 1.5 hours prior to work shift. -Continue current medication regimen. -Send three additional prescriptions to [employer] to cover December, January, and February. -Schedule  follow-up appointment in March.  General Health No current health concerns reported. Maintaining steady weight and engaging in enjoyable activities, including fishing. -Schedule follow-up appointment in March.

## 2023-11-03 ENCOUNTER — Encounter: Payer: Self-pay | Admitting: Family Medicine

## 2023-11-04 ENCOUNTER — Other Ambulatory Visit: Payer: Self-pay | Admitting: Family Medicine

## 2023-11-04 MED ORDER — LISDEXAMFETAMINE DIMESYLATE 40 MG PO CAPS
40.0000 mg | ORAL_CAPSULE | ORAL | 0 refills | Status: DC
Start: 2023-11-04 — End: 2024-03-08

## 2024-01-20 ENCOUNTER — Ambulatory Visit
Admission: RE | Admit: 2024-01-20 | Discharge: 2024-01-20 | Disposition: A | Discharge status: Home / Self Care | Payer: BC Managed Care – PPO | Source: Ambulatory Visit | Attending: Family Medicine | Admitting: Family Medicine

## 2024-01-20 ENCOUNTER — Other Ambulatory Visit: Payer: Self-pay

## 2024-01-20 VITALS — BP 104/73 | HR 80 | Temp 97.7°F | Resp 20

## 2024-01-20 DIAGNOSIS — R112 Nausea with vomiting, unspecified: Secondary | ICD-10-CM

## 2024-01-20 LAB — POC COVID19/FLU A&B COMBO
Covid Antigen, POC: NEGATIVE
Influenza A Antigen, POC: NEGATIVE
Influenza B Antigen, POC: NEGATIVE

## 2024-01-20 MED ORDER — ONDANSETRON 4 MG PO TBDP: 4.0000 mg | ORAL_TABLET | Freq: Three times a day (TID) | ORAL | 0 refills | Status: DC | PRN

## 2024-01-20 NOTE — Discharge Instructions (Signed)
You most likely have a viral infection.  Symptoms should improve over the next few days.  If you develop chest pain or shortness of breath, go to the emergency room.  COVID-19 and influenza test is negative today  Some things that can make you feel better are: - Increased rest - Increasing fluid with water/sugar free electrolytes - Acetaminophen and ibuprofen as needed for fever/pain - Zofran every 8 hours as needed for nausea/vomiting

## 2024-01-20 NOTE — ED Triage Notes (Signed)
Pt reports emesis, headache, weakness, chills since yesterday.

## 2024-01-20 NOTE — ED Provider Notes (Signed)
[facility]    CSN: [id number] Arrival date & time: 01/20/24  0854      History   Chief Complaint Chief Complaint  Patient presents with   Headache    Very bad headache, nausea, vomiting- got sick at work 3 times. 2nd shift - Entered by patient    HPI James Carson is a 23 y.o. male.   Patient presents today with 1 day history of dry cough, headache, 4 episodes of vomiting yesterday at work, decreased appetite, and weakness.  He states he think he had a fever overnight and broke it because he woke up very sweaty.  No shortness of breath, chest pain, runny or stuffy nose.  No sore throat, abdominal pain, current nausea, or diarrhea.  He has not vomited at all today.  He has been able to drink ginger ale today and keep it down.  Denies recent known sick contacts or recent intake of fast food.  Took Advil yesterday which did seem to help with the headache.  He is requesting a work note.    Past Medical History:  Diagnosis Date   ADHD (attention deficit hyperactivity disorder)    Asthma    Atypical nevi    right shoulder   Learning disability    dyslexia   Tremors of nervous system    benign tremors-Dr. Sharene Skeans    Patient Active Problem List   Diagnosis Date Noted   Pre-syncope 12/24/2021   Acute sinusitis 12/21/2021   Learning disability 07/21/2014   ADD (attention deficit disorder) 12/15/2013    Past Surgical History:  Procedure Laterality Date   CIRCUMCISION     IRRIGATION AND DEBRIDEMENT SEBACEOUS CYST     ritlumph     tubes in the ears         Home Medications    Prior to Admission medications   Medication Sig Start Date End Date Taking? Authorizing Provider  ondansetron (ZOFRAN-ODT) 4 MG disintegrating tablet Take 1 tablet (4 mg total) by mouth every 8 (eight) hours as needed for nausea or vomiting. 01/20/24  Yes Cathlean Marseilles A, NP  ibuprofen (ADVIL) 200 MG tablet Take 200 mg by mouth every 6 (six) hours as needed.    [provider]  lisdexamfetamine (VYVANSE) 40 MG capsule Take 1 capsule (40 mg total) by mouth every morning. 10/14/23   Babs Sciara, MD  lisdexamfetamine (VYVANSE) 40 MG capsule Take 1 capsule (40 mg total) by mouth every morning. 10/14/23   Babs Sciara, MD  lisdexamfetamine (VYVANSE) 40 MG capsule Take 1 capsule (40 mg total) by mouth every morning. 11/04/23   Babs Sciara, MD  silver sulfADIAZINE (SILVADENE) 1 % cream Apply 1 Application topically 2 (two) times daily. 07/22/23   Particia Nearing, PA-C    Family History Family History  Problem Relation Age of Onset   Healthy Mother    Healthy Father     Social History Social History   Tobacco Use   Smoking status: Never   Smokeless tobacco: Never  Substance Use Topics   Alcohol use: No    Alcohol/week: 0.0 standard drinks of alcohol   Drug use: Never     Allergies   Adderall xr [amphetamine-dextroamphet er], Concerta [methylphenidate], and Focalin [dexmethylphenidate]   Review of Systems Review of Systems Per HPI  Physical Exam Triage Vital Signs ED Triage Vitals  Encounter Vitals Group     BP 01/20/24 0911 104/73     Systolic BP Percentile --  Diastolic BP Percentile --      Pulse Rate 01/20/24 0911 80     Resp 01/20/24 0911 20     Temp 01/20/24 0911 97.7 F (36.5 C)     Temp Source 01/20/24 0911 Oral     SpO2 01/20/24 0911 95 %     Weight --      Height --      Head Circumference --      Peak Flow --      Pain Score 01/20/24 0909 10     Pain Loc --      Pain Education --      Exclude from Growth Chart --    No data found.  Updated Vital Signs BP 104/73 (BP Location: Right Arm)   Pulse 80   Temp 97.7 F (36.5 C) (Oral)   Resp 20   SpO2 95%   Visual Acuity Right Eye Distance:   Left Eye Distance:   Bilateral Distance:    Right Eye Near:   Left Eye Near:    Bilateral Near:     Physical Exam Vitals and nursing note reviewed.  Constitutional:      General: He is not in  acute distress.    Appearance: Normal appearance. He is not toxic-appearing.  HENT:     Head: Normocephalic and atraumatic.     Right Ear: Tympanic membrane, ear canal and external ear normal.     Left Ear: Tympanic membrane, ear canal and external ear normal.     Mouth/Throat:     Mouth: Mucous membranes are moist.     Pharynx: Oropharynx is clear. No posterior oropharyngeal erythema.  Cardiovascular:     Rate and Rhythm: Normal rate and regular rhythm.  Pulmonary:     Effort: Pulmonary effort is normal. No respiratory distress.     Breath sounds: Normal breath sounds. No wheezing, rhonchi or rales.  Abdominal:     General: Abdomen is flat. Bowel sounds are normal. There is no distension.     Palpations: Abdomen is soft.     Tenderness: There is no abdominal tenderness. There is no right CVA tenderness, left CVA tenderness, guarding or rebound.  Musculoskeletal:     Cervical back: Normal range of motion.  Lymphadenopathy:     Cervical: No cervical adenopathy.  Skin:    General: Skin is warm and dry.     Capillary Refill: Capillary refill takes less than 2 seconds.     Coloration: Skin is not jaundiced or pale.     Findings: No erythema.  Neurological:     Mental Status: He is alert.     Motor: No weakness.     Gait: Gait normal.  Psychiatric:        Behavior: Behavior is cooperative.      UC Treatments / Results  Labs (all labs ordered are listed, but only abnormal results are displayed) Labs Reviewed  POC COVID19/FLU A&B COMBO    EKG   Radiology No results found.  Procedures Procedures (including critical care time)  Medications Ordered in UC Medications - No data to display  Initial Impression / Assessment and Plan / UC Course  I have reviewed the triage vital signs and the nursing notes.  Pertinent labs & imaging results that were available during my care of the patient were reviewed by me and considered in my medical decision making (see chart for  details).   Patient is well-appearing, normotensive, afebrile, not tachycardic, not tachypneic, oxygenating well on room  air.    1. Nausea and vomiting, unspecified vomiting type Vitals and exam are reassuring today COVID-19 and influenza testing is negative today Suspect viral gastroenteritis versus food poisoning Treat with Zofran 4 mg every 8 hours as needed for nausea/vomiting, push hydration Strict ER precautions discussed Work excuse provided  The patient was given the opportunity to ask questions.  All questions answered to their satisfaction.  The patient is in agreement to this plan.    Final Clinical Impressions(s) / UC Diagnoses   Final diagnoses:  Nausea and vomiting, unspecified vomiting type     Discharge Instructions      You most likely have a viral infection.  Symptoms should improve over the next few days.  If you develop chest pain or shortness of breath, go to the emergency room.  COVID-19 and influenza test is negative today  Some things that can make you feel better are: - Increased rest - Increasing fluid with water/sugar free electrolytes - Acetaminophen and ibuprofen as needed for fever/pain - Zofran every 8 hours as needed for nausea/vomiting     ED Prescriptions     Medication Sig Dispense Auth. Provider   ondansetron (ZOFRAN-ODT) 4 MG disintegrating tablet Take 1 tablet (4 mg total) by mouth every 8 (eight) hours as needed for nausea or vomiting. 20 tablet Valentino Nose, NP      PDMP not reviewed this encounter.   Valentino Nose, NP 01/20/24 1040

## 2024-02-02 ENCOUNTER — Encounter: Payer: Self-pay | Admitting: Family Medicine

## 2024-02-02 ENCOUNTER — Other Ambulatory Visit: Payer: Self-pay | Admitting: Family Medicine

## 2024-02-02 MED ORDER — LISDEXAMFETAMINE DIMESYLATE 40 MG PO CAPS: 40.0000 mg | ORAL_CAPSULE | ORAL | 0 refills | Status: DC

## 2024-02-02 NOTE — Progress Notes (Signed)
 Please provide patient with a copy of this if he would like to have a printed copy otherwise he can get it through [employer]

## 2024-02-09 ENCOUNTER — Encounter: Payer: Self-pay | Admitting: Family Medicine

## 2024-02-09 ENCOUNTER — Other Ambulatory Visit: Payer: Self-pay | Admitting: Family Medicine

## 2024-02-09 MED ORDER — LISDEXAMFETAMINE DIMESYLATE 40 MG PO CAPS: 40.0000 mg | ORAL_CAPSULE | ORAL | 0 refills | Status: DC

## 2024-02-09 NOTE — Telephone Encounter (Signed)
 Nurses Please cancel appointment for [facility] for tomorrow  [facility] his new job goes well I sent in 1 additional refill that can be filled in early April through [employer] for his ADD medicine That will cover him through early May  It would be wise for him to be seen for his ADD hopefully that can happen in the near future Family can set him up for follow-up either with myself or if my schedule is full with [place] our PA

## 2024-02-10 ENCOUNTER — Ambulatory Visit: Payer: BC Managed Care – PPO | Admitting: Family Medicine

## 2024-03-08 ENCOUNTER — Other Ambulatory Visit: Payer: Self-pay

## 2024-03-08 ENCOUNTER — Ambulatory Visit: Admitting: Family Medicine

## 2024-03-08 ENCOUNTER — Encounter: Payer: Self-pay | Admitting: Family Medicine

## 2024-03-08 VITALS — BP 109/58 | HR 65 | Temp 98.2°F | Ht 73.0 in | Wt 189.0 lb

## 2024-03-08 DIAGNOSIS — F4321 Adjustment disorder with depressed mood: Secondary | ICD-10-CM | POA: Diagnosis not present

## 2024-03-08 DIAGNOSIS — F909 Attention-deficit hyperactivity disorder, unspecified type: Secondary | ICD-10-CM

## 2024-03-08 MED ORDER — LISDEXAMFETAMINE DIMESYLATE 40 MG PO CAPS: 40.0000 mg | ORAL_CAPSULE | ORAL | 0 refills | Status: DC

## 2024-03-08 MED ORDER — LISDEXAMFETAMINE DIMESYLATE 40 MG PO CAPS
40.0000 mg | ORAL_CAPSULE | ORAL | 0 refills | Status: DC
Start: 2024-03-08 — End: 2024-05-31

## 2024-03-08 MED ORDER — LISDEXAMFETAMINE DIMESYLATE 40 MG PO CAPS
40.0000 mg | ORAL_CAPSULE | ORAL | 0 refills | Status: DC
Start: 2024-03-08 — End: 2024-05-05

## 2024-03-08 NOTE — Progress Notes (Signed)
   Subjective:    Patient ID: James Carson, male    DOB: 01-Dec-2001, 23 y.o.   MRN: [id number]  HPI This patient has adult ADD. Takes medication responsibly. Medication does help the patient focus in be more functional. Patient relates that they are or not abusing the medication or misusing the medication. The patient understands that if they're having any negative side effects such as elevated high blood pressure severe headaches they would need stop the medication follow-up immediately. They also understand that the prescriptions are to last for 3 months then the patient will need to follow-up before having further prescriptions.  Patient compliance daily  Does medication help patient function /attention better  yes  Side effects  none    Review of Systems     Objective:   Physical Exam  General-in no acute distress Eyes-no discharge Lungs-respiratory rate normal, CTA CV-no murmurs,RRR Extremities skin warm dry no edema Neuro grossly normal Behavior normal, alert       Assessment & Plan:  1. Attention deficit hyperactivity disorder (ADHD), unspecified ADHD type (Primary) The patient was seen today as part of the visit regarding ADD.  Patient is stable on current regimen.  Appropriate prescriptions prescribed.  Medications were reviewed with the patient as well as compliance. Side effects were checked for. Discussion regarding effectiveness was held. Prescriptions were electronically sent in.  Patient reminded to follow-up in approximately 3 months.   Plans to [facility] law with drug registry was checked and verified while present with the patient.   2. Grief Patient having difficult time for the loss of his grandfather who died unexpectedly several months back.  We will help set up with counseling.  He would like to go where his mother went if possible so we will refer to [facility] psychiatric

## 2024-05-05 ENCOUNTER — Encounter: Payer: Self-pay | Admitting: Family Medicine

## 2024-05-05 ENCOUNTER — Other Ambulatory Visit: Payer: Self-pay | Admitting: Family Medicine

## 2024-05-05 MED ORDER — LISDEXAMFETAMINE DIMESYLATE 40 MG PO CAPS: 40.0000 mg | ORAL_CAPSULE | ORAL | 0 refills | Status: DC

## 2024-05-05 NOTE — Telephone Encounter (Signed)
 Nurses-please cancel previous prescription for June 3 I am sending in corrected prescription Please call [facility] to do this thank you

## 2024-05-10 ENCOUNTER — Encounter: Payer: Self-pay | Admitting: Physician Assistant

## 2024-05-10 ENCOUNTER — Ambulatory Visit: Admitting: Physician Assistant

## 2024-05-10 ENCOUNTER — Ambulatory Visit: Payer: Self-pay

## 2024-05-10 VITALS — BP 117/70 | Temp 97.8°F | Ht 73.0 in | Wt 202.0 lb

## 2024-05-10 DIAGNOSIS — L237 Allergic contact dermatitis due to plants, except food: Secondary | ICD-10-CM | POA: Diagnosis not present

## 2024-05-10 MED ORDER — METHYLPREDNISOLONE ACETATE 40 MG/ML IJ SUSP
40.0000 mg | Freq: Once | INTRAMUSCULAR | Status: AC
  Administered 2024-05-10: 40 mg via INTRAMUSCULAR

## 2024-05-10 NOTE — Assessment & Plan Note (Signed)
 Patient presents today with erythematous, vesicular rash on his left medial ankle consistent with poison ivy dermatitis. No signs of secondary infection. Patient reports minimal pain but intense itching. IM methylprednisolone injection today for symptoms. We discussed supportive care at home to include avoiding itching, topical steroids/anti-itch lotions, cold compresses, and over the counter antihistamines. Patient advised to wear protective clothing outdoors. Patient to follow up in 5-7 days if no improvement of symptoms or sooner for worsening symptoms of signs of infection.

## 2024-05-10 NOTE — Telephone Encounter (Signed)
 FYI Only or Action Required?: FYI only for provider  Patient was last seen in primary care on 03/08/2024 by Bennet Brasil, MD. [facility] Nurse Triage reporting Poison Ivy. Symptoms began several days ago. Interventions attempted: OTC medications: steroid cream and Other: clean and covered. Symptoms are: gradually worsening.  Triage Disposition: See HCP Within 4 Hours (Or PCP Triage)  Patient/caregiver understands and will follow disposition?: Yes  Patient also has concern of his ADD appt being cancelled for upcoming August appt. Will address in office.      Reason for Disposition  [1] Severe poison ivy, oak, or sumac reaction in the past AND [2] face or genitals involved  Answer Assessment - Initial Assessment Questions 1. APPEARANCE of RASH: "Describe the rash."      Red bumps with blisters, last night was oozing clear liquid 2. LOCATION: "Where is the rash located?"  (e.g., face, genitals, hands, legs)     Both ankles 3. SIZE: "How large is the rash?"      Just around the ankles for now 4. ONSET: "When did the rash begin?"      Saturday 5. ITCHING: "Does the rash itch?" If Yes, ask: "How bad is it?"   - MILD - doesn't interfere with normal activities   - MODERATE-SEVERE: interferes with work, school, sleep, or other activities      Not itching at all but patient states it is spreading, patient states he is having mild pain where he is having the blisters 6. EXPOSURE:  "How were you exposed to the plant (poison ivy, poison oak, sumac)"  "When were you exposed?"       Poison Ivy - Saturday while weedeating 7. PAST HISTORY: "Have you had a poison ivy rash before?" If Yes, ask: "How bad was it?"     Has had in past and it got bad because patient states he is highly allergic  Protocols used: Poison Ivy - Oak - [facility]

## 2024-05-10 NOTE — Progress Notes (Signed)
   Acute Office Visit  Subjective:     Patient ID: James Carson, male    DOB: 2001/03/18, 23 y.o.   MRN: [id number]   [name] This is a new problem. The current episode started in the past 7 days. The problem is unchanged. The affected locations include the left ankle. The rash is characterized by blistering, draining, itchiness and pain. He was exposed to plant contact. Pertinent negatives include no cough, facial edema, fatigue, fever or joint pain. Past treatments include anti-itch cream and topical steroids. The treatment provided mild relief.    Review of Systems  Constitutional:  Negative for fatigue, fever and malaise/fatigue.  Respiratory:  Negative for cough.   Musculoskeletal:  Negative for joint pain.  Skin:  Positive for itching and rash.        Objective:     BP 117/70   Temp 97.8 F (36.6 C)   Ht 6\' 1"  (1.854 m)   Wt 202 lb (91.6 kg)   SpO2 97%   BMI 26.65 kg/m   Physical Exam Constitutional:      Appearance: Normal appearance. He is normal weight.  HENT:     Head: Normocephalic and atraumatic.     Nose: Nose normal.     Mouth/Throat:     Mouth: Mucous membranes are moist.     Pharynx: Oropharynx is clear.  Cardiovascular:     Rate and Rhythm: Normal rate and regular rhythm.     Heart sounds: Normal heart sounds.  Pulmonary:     Effort: Pulmonary effort is normal.     Breath sounds: Normal breath sounds.  Skin:    General: Skin is warm and dry.     Findings: Rash (left medial ankle) present.  Neurological:     Mental Status: He is alert.     No results found for any visits on 05/10/24.      Assessment & Plan:  Poison ivy dermatitis Assessment & Plan: Patient presents today with erythematous, vesicular rash on his left medial ankle consistent with poison ivy dermatitis. No signs of secondary infection. Patient reports minimal pain but intense itching. IM methylprednisolone injection today for symptoms. We discussed supportive care at  home to include avoiding itching, topical steroids/anti-itch lotions, cold compresses, and over the counter antihistamines. Patient advised to wear protective clothing outdoors. Patient to follow up in 5-7 days if no improvement of symptoms or sooner for worsening symptoms of signs of infection.    Orders: -     methylPREDNISolone Acetate    Return if symptoms worsen or fail to improve.  Jearlean Mince Shriyans Kuenzi, PA-C

## 2024-05-14 ENCOUNTER — Encounter: Payer: Self-pay | Admitting: Physician Assistant

## 2024-05-14 ENCOUNTER — Encounter: Payer: Self-pay | Admitting: Family Medicine

## 2024-05-14 ENCOUNTER — Other Ambulatory Visit: Payer: Self-pay | Admitting: Family Medicine

## 2024-05-14 MED ORDER — PREDNISONE 20 MG PO TABS: ORAL_TABLET | ORAL | 0 refills | Status: DC

## 2024-05-15 ENCOUNTER — Ambulatory Visit

## 2024-05-29 ENCOUNTER — Encounter: Payer: Self-pay | Admitting: Family Medicine

## 2024-05-31 ENCOUNTER — Other Ambulatory Visit: Payer: Self-pay | Admitting: Family Medicine

## 2024-05-31 MED ORDER — LISDEXAMFETAMINE DIMESYLATE 40 MG PO CAPS: 40.0000 mg | ORAL_CAPSULE | ORAL | 0 refills | Status: DC

## 2024-06-01 ENCOUNTER — Ambulatory Visit
Admission: RE | Admit: 2024-06-01 | Discharge: 2024-06-01 | Disposition: A | Discharge status: Home / Self Care | Source: Ambulatory Visit | Attending: Nurse Practitioner | Admitting: Nurse Practitioner

## 2024-06-01 VITALS — BP 125/80 | HR 62 | Temp 97.9°F | Resp 18

## 2024-06-01 DIAGNOSIS — J069 Acute upper respiratory infection, unspecified: Secondary | ICD-10-CM

## 2024-06-01 LAB — POC COVID19/FLU A&B COMBO
Covid Antigen, POC: NEGATIVE
Influenza A Antigen, POC: NEGATIVE
Influenza B Antigen, POC: NEGATIVE

## 2024-06-01 NOTE — Discharge Instructions (Signed)
 You have a viral upper respiratory infection.  Symptoms should improve over the next week to 10 days.  If you develop chest pain or shortness of breath, go to the emergency room.  COVID-19 and influenza test are negative today.  Some things that can make you feel better are: - Increased rest - Increasing fluid with water/sugar free electrolytes - Acetaminophen  and ibuprofen  as needed for fever/pain - Salt water gargling, chloraseptic spray and throat lozenges - OTC guaifenesin (Mucinex) 600 mg twice daily - Saline sinus flushes or a neti pot - Humidifying the air

## 2024-06-01 NOTE — ED Triage Notes (Signed)
 Feels weak, headache, cough since yesterday.

## 2024-06-01 NOTE — ED Provider Notes (Signed)
 [facility]    CSN: [id number] Arrival date & time: 06/01/24  1114      History   Chief Complaint Chief Complaint  Patient presents with   Cough    Chest cold, feel like coming down with something. Can't miss work. - Entered by patient    HPI James Carson is a 23 y.o. male.   Patient presents today for 1 day history of body aches, congestion and dry cough, chest pain with cough, stuffy nose, headache, and fatigue.  He denies fever, shortness of breath, runny nose, sore throat, ear pain, abdominal pain, nausea/vomiting, diarrhea, and significantly decreased appetite.  No known sick contacts.  Reports he has been taking allergy pill daily and Tylenol , which did help with the headache yesterday.    Past Medical History:  Diagnosis Date   ADHD (attention deficit hyperactivity disorder)    Asthma    Atypical nevi    right shoulder   Learning disability    dyslexia   Tremors of nervous system    benign tremors-Dr. Susen    Patient Active Problem List   Diagnosis Date Noted   Poison ivy dermatitis 05/10/2024   Pre-syncope 12/24/2021   Learning disability 07/21/2014   ADD (attention deficit disorder) 12/15/2013    Past Surgical History:  Procedure Laterality Date   CIRCUMCISION     IRRIGATION AND DEBRIDEMENT SEBACEOUS CYST     ritlumph     tubes in the ears         Home Medications    Prior to Admission medications   Medication Sig Start Date End Date Taking? Authorizing Provider  ibuprofen  (ADVIL ) 200 MG tablet Take 200 mg by mouth every 6 (six) hours as needed.    [provider]  lisdexamfetamine (VYVANSE ) 40 MG capsule Take 1 capsule (40 mg total) by mouth every morning. 05/05/24   Alphonsa Glendia LABOR, MD  lisdexamfetamine (VYVANSE ) 40 MG capsule Take 1 capsule (40 mg total) by mouth every morning. 05/31/24   Alphonsa Glendia LABOR, MD  lisdexamfetamine (VYVANSE ) 40 MG capsule Take 1 capsule (40 mg total) by mouth every morning. 05/31/24    Alphonsa Glendia LABOR, MD  ondansetron  (ZOFRAN -ODT) 4 MG disintegrating tablet Take 1 tablet (4 mg total) by mouth every 8 (eight) hours as needed for nausea or vomiting. 01/20/24   Chandra Harlene LABOR, NP    Family History Family History  Problem Relation Age of Onset   Healthy Mother    Healthy Father     Social History Social History   Tobacco Use   Smoking status: Never   Smokeless tobacco: Never  Substance Use Topics   Alcohol use: No    Alcohol/week: 0.0 standard drinks of alcohol   Drug use: Never     Allergies   Adderall xr [amphetamine -dextroamphet er], Concerta  [methylphenidate ], and Focalin  [dexmethylphenidate ]   Review of Systems Review of Systems Per HPI  Physical Exam Triage Vital Signs ED Triage Vitals  Encounter Vitals Group     BP 06/01/24 1123 125/80     Girls Systolic BP Percentile --      Girls Diastolic BP Percentile --      Boys Systolic BP Percentile --      Boys Diastolic BP Percentile --      Pulse Rate 06/01/24 1123 62     Resp 06/01/24 1123 18     Temp 06/01/24 1123 97.9 F (36.6 C)     Temp Source 06/01/24 1123 Oral  SpO2 06/01/24 1123 98 %     Weight --      Height --      Head Circumference --      Peak Flow --      Pain Score 06/01/24 1124 0     Pain Loc --      Pain Education --      Exclude from Growth Chart --    No data found.  Updated Vital Signs BP 125/80 (BP Location: Right Arm)   Pulse 62   Temp 97.9 F (36.6 C) (Oral)   Resp 18   SpO2 98%   Visual Acuity Right Eye Distance:   Left Eye Distance:   Bilateral Distance:    Right Eye Near:   Left Eye Near:    Bilateral Near:     Physical Exam Vitals and nursing note reviewed.  Constitutional:      General: He is not in acute distress.    Appearance: Normal appearance. He is not ill-appearing or toxic-appearing.  HENT:     Head: Normocephalic and atraumatic.     Right Ear: Tympanic membrane, ear canal and external ear normal.     Left Ear: Tympanic  membrane, ear canal and external ear normal.     Nose: No congestion or rhinorrhea.     Mouth/Throat:     Mouth: Mucous membranes are moist.     Pharynx: Oropharynx is clear. Postnasal drip present. No oropharyngeal exudate or posterior oropharyngeal erythema.   Eyes:     General: No scleral icterus.    Extraocular Movements: Extraocular movements intact.    Cardiovascular:     Rate and Rhythm: Normal rate and regular rhythm.  Pulmonary:     Effort: Pulmonary effort is normal. No respiratory distress.     Breath sounds: Normal breath sounds. No wheezing, rhonchi or rales.   Musculoskeletal:     Cervical back: Normal range of motion and neck supple.  Lymphadenopathy:     Cervical: No cervical adenopathy.   Skin:    General: Skin is warm and dry.     Coloration: Skin is not jaundiced or pale.     Findings: No erythema or rash.   Neurological:     Mental Status: He is alert and oriented to person, place, and time.   Psychiatric:        Behavior: Behavior is cooperative.      UC Treatments / Results  Labs (all labs ordered are listed, but only abnormal results are displayed) Labs Reviewed  POC COVID19/FLU A&B COMBO - Normal    EKG   Radiology No results found.  Procedures Procedures (including critical care time)  Medications Ordered in UC Medications - No data to display  Initial Impression / Assessment and Plan / UC Course  I have reviewed the triage vital signs and the nursing notes.  Pertinent labs & imaging results that were available during my care of the patient were reviewed by me and considered in my medical decision making (see chart for details).   Patient is well-appearing, normotensive, afebrile, not tachycardic, not tachypneic, oxygenating well on room air.   1. Viral URI with cough Suspect viral etiology Vitals and exam are reassuring today 19, influenza testing is negative Supportive care discussed with patient ER and return precautions  discussed Work excuse provided  The patient was given the opportunity to ask questions.  All questions answered to their satisfaction.  The patient is in agreement to this plan.   Final Clinical  Impressions(s) / UC Diagnoses   Final diagnoses:  Viral URI with cough     Discharge Instructions      You have a viral upper respiratory infection.  Symptoms should improve over the next week to 10 days.  If you develop chest pain or shortness of breath, go to the emergency room.  COVID-19 and influenza test are negative today.  Some things that can make you feel better are: - Increased rest - Increasing fluid with water/sugar free electrolytes - Acetaminophen  and ibuprofen  as needed for fever/pain - Salt water gargling, chloraseptic spray and throat lozenges - OTC guaifenesin (Mucinex) 600 mg twice daily - Saline sinus flushes or a neti pot - Humidifying the air     ED Prescriptions   None    PDMP not reviewed this encounter.   Chandra Harlene LABOR, NP 06/01/24 [phone]

## 2024-06-07 ENCOUNTER — Ambulatory Visit: Admitting: Nurse Practitioner

## 2024-06-07 VITALS — BP 104/66 | HR 107 | Temp 97.9°F | Ht 73.0 in | Wt 196.2 lb

## 2024-06-07 DIAGNOSIS — K529 Noninfective gastroenteritis and colitis, unspecified: Secondary | ICD-10-CM | POA: Diagnosis not present

## 2024-06-07 MED ORDER — ONDANSETRON 4 MG PO TBDP: 4.0000 mg | ORAL_TABLET | Freq: Three times a day (TID) | ORAL | 0 refills | Status: AC | PRN

## 2024-06-08 ENCOUNTER — Encounter: Payer: Self-pay | Admitting: Nurse Practitioner

## 2024-06-08 NOTE — Progress Notes (Signed)
   Subjective:    Patient ID: James Carson, male    DOB: 2001-04-11, 23 y.o.   MRN: [id number]  HPI Presents for complaints of vomiting and diarrhea that occurred yesterday.  Patient questions whether it could be food poisoning.  States he went out to eat pizza at lunch.  Complaints of shakiness headache and sweating with possible fever.  Began having nausea around 3:30 in the afternoon, sudden onset vomiting around midnight.  States he vomited about 10 times.  Has vomited twice this morning the last time was around 7 AM, approximately 8 hours ago.  Has been drinking fluids such as water and ginger ale.  Diarrhea x 1.  No obvious blood in his stool or change in color.  1 other person at work has been sick, otherwise no known contacts.   Review of Systems See HPI    Objective:   Physical Exam NAD.  Alert, oriented.  Lungs clear.  Heart regular rate rhythm.  Abdomen soft nondistended with active bowel sounds x 4.  Minimal tenderness to palpation in the epigastric area.  No rebound or guarding. Today's Vitals   06/07/24 1520  BP: 104/66  Pulse: (!) 107  Temp: 97.9 F (36.6 C)  SpO2: 96%  Weight: 196 lb 3.2 oz (89 kg)  Height: 6' 1 (1.854 m)   Body mass index is 25.89 kg/m.        Assessment & Plan:  Gastroenteritis Meds ordered this encounter  Medications   ondansetron  (ZOFRAN -ODT) 4 MG disintegrating tablet    Sig: Take 1 tablet (4 mg total) by mouth every 8 (eight) hours as needed for nausea or vomiting.    Dispense:  20 tablet    Refill:  0    Supervising Provider:   ALPHONSA GLENDIA LABOR [phone]   While it is possible for this to be related to food consumption, based on his symptoms and description this is most likely viral.  Increase clear fluid intake until urine is light yellow or clear. Given refill on Zofran  to have on hand in case it is needed. Recommend bland diet with gradual resumption of regular diet.  Avoid fatty or spicy foods at this point. Return if symptoms  worsen or fail to improve, work excuse given for today.

## 2024-06-29 ENCOUNTER — Ambulatory Visit: Admitting: Family Medicine

## 2024-06-29 ENCOUNTER — Encounter: Payer: Self-pay | Admitting: Family Medicine

## 2024-06-29 VITALS — BP 107/69 | HR 89 | Temp 98.9°F | Ht 73.0 in | Wt 198.0 lb

## 2024-06-29 DIAGNOSIS — F909 Attention-deficit hyperactivity disorder, unspecified type: Secondary | ICD-10-CM | POA: Diagnosis not present

## 2024-06-29 DIAGNOSIS — J019 Acute sinusitis, unspecified: Secondary | ICD-10-CM

## 2024-06-29 MED ORDER — LISDEXAMFETAMINE DIMESYLATE 40 MG PO CAPS: 40.0000 mg | ORAL_CAPSULE | ORAL | 0 refills | Status: DC

## 2024-06-29 MED ORDER — AZITHROMYCIN 250 MG PO TABS: ORAL_TABLET | ORAL | 0 refills | Status: AC

## 2024-06-29 MED ORDER — AMOXICILLIN-POT CLAVULANATE 875-125 MG PO TABS
1.0000 | ORAL_TABLET | Freq: Two times a day (BID) | ORAL | 0 refills | Status: DC
Start: 2024-06-29 — End: 2024-08-16

## 2024-06-29 NOTE — Progress Notes (Signed)
   Subjective:    Patient ID: James Carson, male    DOB: May 09, 2001, 23 y.o.   MRN: [id number]  HPI cough and sore throat x's 3 weeks. Recent sinus infection.  Started off with head congestion drainage coughing Then progressed and chest congestion Head congestion is doing better Denies high fever chills No wheezing or difficulty breathing   This patient has adult ADD. Takes medication responsibly. Medication does help the patient focus in be more functional. Patient relates that they are or not abusing the medication or misusing the medication. The patient understands that if they're having any negative side effects such as elevated high blood pressure severe headaches they would need stop the medication follow-up immediately. They also understand that the prescriptions are to last for 3 months then the patient will need to follow-up before having further prescriptions.  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  Does medication help patient function /attention better denies any side effects he states it does help him  Side effects denies side effects    Review of Systems     Objective:   Physical Exam  Gen-NAD not toxic TMS-normal bilateral T- normal no redness Chest-CTA respiratory rate normal no crackles CV RRR no murmur Skin-warm dry Neuro-grossly normal       Assessment & Plan:  Probable acute rhinosinusitis postnasal drainage Will cover with dual antibiotics to cover for any possibility of pneumonia I do not hear any pneumonia on physical exam I would not recommend further test such as lab work or images unless his symptoms get worse-follow-up if high fevers chills difficulty breathing  The patient was seen today as part of the visit regarding ADD.  Patient is stable on current regimen.  Appropriate prescriptions prescribed.  Medications were reviewed  with the patient as well as compliance. Side effects were checked for. Discussion regarding effectiveness was held. Prescriptions were electronically sent in.  Patient reminded to follow-up in approximately 3 months.   Plans to [place]  law with drug registry was checked and verified while present with the patient. Follow-up 4 months

## 2024-07-06 ENCOUNTER — Ambulatory Visit: Payer: Self-pay

## 2024-07-06 ENCOUNTER — Encounter: Payer: Self-pay | Admitting: Family Medicine

## 2024-07-06 ENCOUNTER — Ambulatory Visit: Admitting: Nurse Practitioner

## 2024-07-06 ENCOUNTER — Encounter: Payer: Self-pay | Admitting: Nurse Practitioner

## 2024-07-06 VITALS — BP 132/78 | HR 78 | Temp 97.2°F | Ht 73.0 in | Wt 205.0 lb

## 2024-07-06 DIAGNOSIS — L237 Allergic contact dermatitis due to plants, except food: Secondary | ICD-10-CM

## 2024-07-06 MED ORDER — CLOBETASOL PROPIONATE 0.05 % EX CREA: 1.0000 | TOPICAL_CREAM | Freq: Two times a day (BID) | CUTANEOUS | 0 refills | Status: AC

## 2024-07-06 MED ORDER — PREDNISONE 20 MG PO TABS: ORAL_TABLET | ORAL | 0 refills | Status: DC

## 2024-07-06 MED ORDER — METHYLPREDNISOLONE ACETATE 40 MG/ML IJ SUSP
40.0000 mg | Freq: Once | INTRAMUSCULAR | Status: AC
  Administered 2024-07-06: 40 mg via INTRAMUSCULAR

## 2024-07-06 NOTE — Telephone Encounter (Signed)
   FYI Only or Action Required?: FYI only for provider.  Patient was last seen in primary care on 06/29/2024 by Alphonsa Glendia LABOR, MD.  [facility] Nurse Triage reporting [facility].  Symptoms began today.  Interventions attempted: Nothing.  Symptoms are: gradually worsening.  Triage Disposition: See Physician Within 24 Hours  Patient/caregiver understands and will follow disposition?: Yes - pt is getting married on Saturday.                Copied from CRM #[id number]. Topic: Clinical - Red Word Triage >> Jul 06, 2024  8:48 AM Avram MATSU wrote: Red Word that prompted transfer to Nurse Triage: poison oak/ivy  itchy and painful and actively spreading. Reason for Disposition  MODERATE to SEVERE itching (e.g., interferes with work, school, sleep, or other activities)  Answer Assessment - Initial Assessment Questions 1. APPEARANCE of RASH: What does the rash look like?      Rash on hand, ankle and face- red itchy rash  3. SIZE: How large is the rash?      Small but spreading 4. ONSET: When did the rash begin?      This morning 5. ITCHING: Does the rash itch? If Yes, ask: How bad is it?     yes 6. EXPOSURE:  How were you exposed to the plant (poison ivy, poison oak, sumac)  When were you exposed?  Note: Sometimes a poison ivy/oak/sumac rash does not appear for 2 to 3 weeks after exposure.      Poison ivy 7. PAST HISTORY: Have you had a poison ivy rash before? If Yes, ask: How bad was it?     yes 8. OTHER SYMPTOMS: Do you have any other symptoms? (e.g., fever)      no  Protocols used: Poison Ivy - Oak - Sumac-A-AH

## 2024-07-06 NOTE — Telephone Encounter (Signed)
 Appointment scheduled.

## 2024-07-06 NOTE — Progress Notes (Unsigned)
 Subjective:    Patient ID: James Carson, male    DOB: 12-22-2000, 23 y.o.   MRN: [id number]  HPI Rash from an exposure to poison ivy/ or oak on this Sunday  Located on left eye, right hand, ankles Was seen for poison oak rash on 05/10/2024.  Was given a shot of methylprednisolone  40 mg IM. Was given a 9-day oral steroid taper on 05/14/2024.  Patient concerned because he is getting married this upcoming weekend.  Rash is limited to a few lesions on his wrist and ankles and 1 small lesion on his face near the left side of the nose.  Rash seems to be slightly worse this morning, took 2 doses of his leftover oral prednisone .  Review of Systems  Constitutional:  Negative for fever.  HENT:  Negative for trouble swallowing.   Respiratory:  Negative for cough, chest tightness, shortness of breath and wheezing.   Cardiovascular:  Negative for chest pain.  Skin:  Positive for rash.   Social History   Tobacco Use   Smoking status: Never   Smokeless tobacco: Never  Substance Use Topics   Alcohol use: No    Alcohol/week: 0.0 standard drinks of alcohol   Drug use: Never        Objective:   Physical Exam Vitals reviewed.  Constitutional:      General: He is not in acute distress. Cardiovascular:     Rate and Rhythm: Normal rate and regular rhythm.  Pulmonary:     Effort: Pulmonary effort is normal.     Breath sounds: Normal breath sounds.  Skin:    Findings: Rash present.     Comments: A few discrete pink papular lesions noted in various stages of healing on the wrist ankles with a small area noted on the left side of the face near the nose.  Neurological:     Mental Status: He is alert and oriented to person, place, and time.  Psychiatric:        Mood and Affect: Mood normal.        Behavior: Behavior normal.        Thought Content: Thought content normal.    Today's Vitals   07/06/24 1448  BP: 132/78  Pulse: 78  Temp: (!) 97.2 F (36.2 C)  SpO2: 96%  Weight: 205 lb  (93 kg)  Height: 6' 1 (1.854 m)   Body mass index is 27.05 kg/m.         Assessment & Plan:   Problem List Items Addressed This Visit       Musculoskeletal and Integument   Allergic contact dermatitis due to plants, except food - Primary   Meds ordered this encounter  Medications   predniSONE  (DELTASONE ) 20 MG tablet    Sig: 3 po qd x 3 d then 2 po qd x 3 d then 1 po qd x 2 d    Dispense:  17 tablet    Refill:  0    Please hold unless patient calls    Supervising Provider:   ALPHONSA HAMILTON A [[id number]]   clobetasol  cream (TEMOVATE ) 0.05 %    Sig: Apply 1 Application topically 2 (two) times daily. Up to 2 weeks at a time in one area    Dispense:  30 g    Refill:  0    Supervising Provider:   ALPHONSA HAMILTON A [[id number]]   methylPREDNISolone  acetate (DEPO-MEDROL ) injection 40 mg   Depo-Medrol  40 mg IM now.  Since rash is  very mild, recommend holding off on prednisone  unless absolutely necessary.  Provided clobetasol  as a strong topical steroid to see if this would work with his rash.  Patient is aware of measures to help prevent future infections. Return if symptoms worsen or fail to improve.

## 2024-07-06 NOTE — Patient Instructions (Signed)
 Claritin or Allegra as directed

## 2024-07-07 ENCOUNTER — Ambulatory Visit: Admitting: Family Medicine

## 2024-07-08 ENCOUNTER — Encounter: Payer: Self-pay | Admitting: Nurse Practitioner

## 2024-07-09 ENCOUNTER — Ambulatory Visit: Admitting: Family Medicine

## 2024-08-16 ENCOUNTER — Encounter: Payer: Self-pay | Admitting: Family Medicine

## 2024-08-16 ENCOUNTER — Other Ambulatory Visit: Payer: Self-pay

## 2024-08-16 ENCOUNTER — Other Ambulatory Visit: Payer: Self-pay | Admitting: Family Medicine

## 2024-08-16 ENCOUNTER — Ambulatory Visit
Admission: EM | Admit: 2024-08-16 | Discharge: 2024-08-16 | Disposition: A | Discharge status: Home / Self Care | Attending: Family Medicine | Admitting: Family Medicine

## 2024-08-16 DIAGNOSIS — J069 Acute upper respiratory infection, unspecified: Secondary | ICD-10-CM

## 2024-08-16 LAB — POC SOFIA SARS ANTIGEN FIA: SARS Coronavirus 2 Ag: NEGATIVE

## 2024-08-16 MED ORDER — AMOXICILLIN-POT CLAVULANATE 875-125 MG PO TABS: 1.0000 | ORAL_TABLET | Freq: Two times a day (BID) | ORAL | 0 refills | Status: DC

## 2024-08-16 NOTE — ED Triage Notes (Signed)
 Pt being seen in UC for sinus congestion, productive cough, and headache since Saturday. Pt reports taking multiple otc medication, robitussin, mucinex, allergy medication with no relief. Pt denies fevers and n/v.

## 2024-08-16 NOTE — ED Provider Notes (Signed)
 [facility]    CSN: [id number] Arrival date & time: 08/16/24  1012      History   Chief Complaint Chief Complaint  Patient presents with   Nasal Congestion   Cough    HPI James Carson is a 23 y.o. male.   Patient presenting today with 2-day history of congestion, productive cough, headache, fatigue.  Denies known fever, chest pain, shortness of breath, abdominal pain, vomiting, diarrhea.  So far trying Robitussin, Mucinex, antihistamines with minimal relief.  Multiple sick contacts recently.  History of asthma not currently on any inhalers.    Past Medical History:  Diagnosis Date   ADHD (attention deficit hyperactivity disorder)    Asthma    Atypical nevi    right shoulder   Learning disability    dyslexia   Tremors of nervous system    benign tremors-Dr. Susen    Patient Active Problem List   Diagnosis Date Noted   Allergic contact dermatitis due to plants, except food 05/10/2024   Pre-syncope 12/24/2021   Learning disability 07/21/2014   ADD (attention deficit disorder) 12/15/2013    Past Surgical History:  Procedure Laterality Date   CIRCUMCISION     IRRIGATION AND DEBRIDEMENT SEBACEOUS CYST     ritlumph     tubes in the ears         Home Medications    Prior to Admission medications   Medication Sig Start Date End Date Taking? Authorizing Provider  amoxicillin -clavulanate (AUGMENTIN ) 875-125 MG tablet Take 1 tablet by mouth 2 (two) times daily. Patient not taking: Reported on 08/16/2024 06/29/24   Alphonsa Glendia LABOR, MD  clobetasol  cream (TEMOVATE ) 0.05 % Apply 1 Application topically 2 (two) times daily. Up to 2 weeks at a time in one area 07/06/24   Mauro Elveria BROCKS, NP  ibuprofen  (ADVIL ) 200 MG tablet Take 200 mg by mouth every 6 (six) hours as needed.    [provider]  lisdexamfetamine (VYVANSE ) 40 MG capsule Take 1 capsule (40 mg total) by mouth every morning. 06/29/24   Alphonsa Glendia LABOR, MD  lisdexamfetamine (VYVANSE )  40 MG capsule Take 1 capsule (40 mg total) by mouth every morning. 06/29/24   Alphonsa Glendia LABOR, MD  lisdexamfetamine (VYVANSE ) 40 MG capsule Take 1 capsule (40 mg total) by mouth every morning. 06/29/24   Alphonsa Glendia LABOR, MD  ondansetron  (ZOFRAN -ODT) 4 MG disintegrating tablet Take 1 tablet (4 mg total) by mouth every 8 (eight) hours as needed for nausea or vomiting. Patient not taking: Reported on 08/16/2024 06/07/24   Mauro Elveria BROCKS, NP  predniSONE  (DELTASONE ) 20 MG tablet 3 po qd x 3 d then 2 po qd x 3 d then 1 po qd x 2 d Patient not taking: Reported on 08/16/2024 07/06/24   Mauro Elveria BROCKS, NP    Family History Family History  Problem Relation Age of Onset   Healthy Mother    Healthy Father     Social History Social History   Tobacco Use   Smoking status: Never   Smokeless tobacco: Never  Vaping Use   Vaping status: Never Used  Substance Use Topics   Alcohol use: No    Alcohol/week: 0.0 standard drinks of alcohol   Drug use: Never     Allergies   Adderall xr [amphetamine -dextroamphet er], Concerta  [methylphenidate ], and Focalin  [dexmethylphenidate ]   Review of Systems Review of Systems Per HPI  Physical Exam Triage Vital Signs ED Triage Vitals  Encounter Vitals Group  BP 08/16/24 1114 135/79     Girls Systolic BP Percentile --      Girls Diastolic BP Percentile --      Boys Systolic BP Percentile --      Boys Diastolic BP Percentile --      Pulse Rate 08/16/24 1114 91     Resp 08/16/24 1114 19     Temp 08/16/24 1114 98 F (36.7 C)     Temp Source 08/16/24 1114 Oral     SpO2 08/16/24 1114 96 %     Weight --      Height --      Head Circumference --      Peak Flow --      Pain Score 08/16/24 1110 6     Pain Loc --      Pain Education --      Exclude from Growth Chart --    No data found.  Updated Vital Signs BP 135/79 (BP Location: Right Arm)   Pulse 91   Temp 98 F (36.7 C) (Oral)   Resp 19   SpO2 96%   Visual Acuity Right Eye Distance:    Left Eye Distance:   Bilateral Distance:    Right Eye Near:   Left Eye Near:    Bilateral Near:     Physical Exam Vitals and nursing note reviewed.  Constitutional:      Appearance: He is well-developed.  HENT:     Head: Atraumatic.     Right Ear: External ear normal.     Left Ear: External ear normal.     Nose: Rhinorrhea present.     Mouth/Throat:     Pharynx: Posterior oropharyngeal erythema present. No oropharyngeal exudate.  Eyes:     Conjunctiva/sclera: Conjunctivae normal.     Pupils: Pupils are equal, round, and reactive to light.  Cardiovascular:     Rate and Rhythm: Normal rate and regular rhythm.  Pulmonary:     Effort: Pulmonary effort is normal. No respiratory distress.     Breath sounds: No wheezing or rales.  Musculoskeletal:        General: Normal range of motion.     Cervical back: Normal range of motion and neck supple.  Lymphadenopathy:     Cervical: No cervical adenopathy.  Skin:    General: Skin is warm and dry.  Neurological:     Mental Status: He is alert and oriented to person, place, and time.  Psychiatric:        Behavior: Behavior normal.      UC Treatments / Results  Labs (all labs ordered are listed, but only abnormal results are displayed) Labs Reviewed  POC SOFIA SARS ANTIGEN FIA    EKG   Radiology No results found.  Procedures Procedures (including critical care time)  Medications Ordered in UC Medications - No data to display  Initial Impression / Assessment and Plan / UC Course  I have reviewed the triage vital signs and the nursing notes.  Pertinent labs & imaging results that were available during my care of the patient were reviewed by me and considered in my medical decision making (see chart for details).     Vitals and exam very reassuring today, rapid COVID-negative.  Suspect viral respiratory infection.  Declines prescription symptomatic medications, discussed supportive over-the-counter medications at home  care.  Return for worsening symptoms.  Final Clinical Impressions(s) / UC Diagnoses   Final diagnoses:  Viral URI with cough   Discharge Instructions  None    ED Prescriptions   None    PDMP not reviewed this encounter.   Stuart Vernell Norris, [place] 08/16/24 1250

## 2024-08-18 ENCOUNTER — Ambulatory Visit: Admitting: Physician Assistant

## 2024-10-18 ENCOUNTER — Encounter: Payer: Self-pay | Admitting: Family Medicine

## 2024-10-18 NOTE — Progress Notes (Signed)
 Letter was dictated he should be able to get it through MyChart

## 2024-10-19 ENCOUNTER — Telehealth: Payer: Self-pay | Admitting: Family Medicine

## 2024-10-19 NOTE — Telephone Encounter (Signed)
 Copied from CRM #[id number]. Topic: General - Other >> Oct 19, 2024  8:38 AM Donna BRAVO wrote: Reason for CRM: Patient mother Amy returning call regarding DOT letter.  Amy is not on DPR list.  Amy SHAUNNA Essex (proxy for Elsie GORMAN Essex Jacques) to [facility] (supporting Glendia DELENA Fielding, MD)

## 2024-11-07 ENCOUNTER — Encounter: Payer: Self-pay | Admitting: Nurse Practitioner

## 2024-11-07 ENCOUNTER — Encounter: Payer: Self-pay | Admitting: Family Medicine

## 2024-11-08 ENCOUNTER — Other Ambulatory Visit: Payer: Self-pay | Admitting: Family Medicine

## 2024-11-08 MED ORDER — LISDEXAMFETAMINE DIMESYLATE 40 MG PO CAPS: 40.0000 mg | ORAL_CAPSULE | ORAL | 0 refills | Status: DC

## 2024-11-08 NOTE — Telephone Encounter (Signed)
 Patient relates he needs a follow-up ADD visit  This needs to be in January-if possible with myself if not possible with myself with Elveria or Damien Loletha Hamilton  Please reach out to patient

## 2024-11-09 ENCOUNTER — Other Ambulatory Visit: Payer: Self-pay | Admitting: Nurse Practitioner

## 2024-12-16 ENCOUNTER — Ambulatory Visit: Admitting: Nurse Practitioner

## 2024-12-16 VITALS — BP 116/78 | HR 87 | Temp 97.9°F | Ht 73.0 in | Wt 209.6 lb

## 2024-12-16 DIAGNOSIS — J4520 Mild intermittent asthma, uncomplicated: Secondary | ICD-10-CM | POA: Diagnosis not present

## 2024-12-16 DIAGNOSIS — F909 Attention-deficit hyperactivity disorder, unspecified type: Secondary | ICD-10-CM | POA: Diagnosis not present

## 2024-12-16 MED ORDER — LISDEXAMFETAMINE DIMESYLATE 40 MG PO CAPS: 40.0000 mg | ORAL_CAPSULE | ORAL | 0 refills | Status: AC

## 2024-12-16 MED ORDER — AIRSUPRA 90-80 MCG/ACT IN AERO: 2.0000 | INHALATION_SPRAY | RESPIRATORY_TRACT | 0 refills | Status: AC | PRN

## 2024-12-17 ENCOUNTER — Encounter: Payer: Self-pay | Admitting: Nurse Practitioner

## 2024-12-17 NOTE — Progress Notes (Signed)
 "  Subjective:    Patient ID: James Carson, male    DOB: 04/29/2001, 24 y.o.   MRN: [id number]  HPI Discussed the use of AI scribe software for clinical note transcription with the patient, who gave verbal consent to proceed.  History of Present Illness James Carson is a 24 year old male with asthma who presents for a routine follow-up and medication management.  He is out of prescriptions for his attention deficit medication, Vyvanse . He is currently taking 40 mg of Vyvanse , which is working well for his concentration. Appetite has improved, leading to a weight increase from 182 lbs to 209 lbs.  He recently experienced a sinus infection and sought care at an urgent care facility where he was prescribed prednisone . His asthma symptoms have been exacerbated by exposure to dust and recent illness. He does not currently have an inhaler and inquires about obtaining one. He mentions a history of asthma in his family, particularly his mother, and notes that his asthma symptoms have worsened over time.  During the review of symptoms, he reports having a fever last week, which has since resolved. He experienced wheezing, particularly after exposure to dust, but notes that the wheezing has decreased. No smoking or vaping. He also mentions a lingering cough but improved and is not currently coughing up colored sputum.     12/16/2024    1:35 PM  Depression screen PHQ 2/9  Decreased Interest 0  Down, Depressed, Hopeless 0  PHQ - 2 Score 0  Altered sleeping 0  Tired, decreased energy 0  Change in appetite 0  Feeling bad or failure about yourself  0  Trouble concentrating 0  Moving slowly or fidgety/restless 0  Suicidal thoughts 0  PHQ-9 Score 0  Difficult doing work/chores Not difficult at all      12/16/2024    1:35 PM 07/06/2024    3:01 PM 03/08/2024   11:10 AM 10/14/2023   10:01 AM  GAD 7 : Generalized Anxiety Score  Nervous, Anxious, on Edge 0 0 3 0  Control/stop  worrying 0 0 3 0  Worry too much - different things 0 0 3 0  Trouble relaxing 0 0 3 0  Restless 0 0 0 0  Easily annoyed or irritable 0 0 3 0  Afraid - awful might happen 0 0 0 0  Total GAD 7 Score 0 0 15 0  Anxiety Difficulty Not difficult at all Not difficult at all Very difficult     Social History[1]      Objective:   Physical Exam NAD.  Alert, oriented.  Calm cheerful affect.  Making good eye contact.  Speech clear.  Normal behavior.  Lungs clear.  Heart regular rate rhythm. Today's Vitals   12/16/24 1334  BP: 116/78  Pulse: 87  Temp: 97.9 F (36.6 C)  SpO2: 99%  Weight: 209 lb 9.6 oz (95.1 kg)  Height: 6' 1 (1.854 m)   Body mass index is 27.65 kg/m.        Assessment & Plan:  1. Attention deficit hyperactivity disorder (ADHD), unspecified ADHD type (Primary) ADHD well-managed with Vyvanse  40 mg. No side effects. Improved concentration and weight gain to baseline. - Continue Vyvanse  40 mg daily.  - lisdexamfetamine  (VYVANSE ) 40 MG capsule; Take 1 capsule (40 mg total) by mouth every morning.  Dispense: 30 capsule; Refill: 0 - lisdexamfetamine  (VYVANSE ) 40 MG capsule; Take 1 capsule (40 mg total) by mouth every morning.  Dispense: 30 capsule; Refill: 0 -  lisdexamfetamine  (VYVANSE ) 40 MG capsule; Take 1 capsule (40 mg total) by mouth every morning.  Dispense: 30 capsule; Refill: 0  2. Mild intermittent asthma without complication Exacerbated by sinus infection and dust. Recent prednisone  treatment. Discussed inhaler options. Airsupra  preferred pending insurance approval. - Attempted to obtain insurance approval for Airsupra  inhaler. - If Airsupra  not covered, prescribe alternative inhaled steroid. - Use inhaler as needed for wheezing or exposure to triggers.  - Albuterol-Budesonide (AIRSUPRA ) 90-80 MCG/ACT AERO; Inhale 2 puffs into the lungs every 4 (four) hours as needed. For wheezing  Dispense: 10.7 g; Refill: 0  Return in about 4 months (around  04/15/2025).         [1]  Social History Tobacco Use   Smoking status: Never   Smokeless tobacco: Never  Vaping Use   Vaping status: Never Used  Substance Use Topics   Alcohol use: No    Alcohol/week: 0.0 standard drinks of alcohol   Drug use: Never   "

## 2025-04-22 ENCOUNTER — Ambulatory Visit: Admitting: Nurse Practitioner
